# Patient Record
Sex: Female | Born: 1970 | Race: White | Hispanic: No | Marital: Married | Smoking: Never smoker
Health system: Southern US, Community
[De-identification: ages and names within clinical notes are randomized; demographics above are authoritative.]

## PROBLEM LIST (undated history)

## (undated) DIAGNOSIS — F32A Depression, unspecified: Secondary | ICD-10-CM

## (undated) DIAGNOSIS — I10 Essential (primary) hypertension: Secondary | ICD-10-CM

## (undated) DIAGNOSIS — Z9889 Other specified postprocedural states: Secondary | ICD-10-CM

## (undated) DIAGNOSIS — R51 Headache: Secondary | ICD-10-CM

## (undated) DIAGNOSIS — F329 Major depressive disorder, single episode, unspecified: Secondary | ICD-10-CM

## (undated) DIAGNOSIS — F419 Anxiety disorder, unspecified: Secondary | ICD-10-CM

## (undated) DIAGNOSIS — E119 Type 2 diabetes mellitus without complications: Secondary | ICD-10-CM

## (undated) DIAGNOSIS — G35 Multiple sclerosis: Principal | ICD-10-CM

## (undated) DIAGNOSIS — R112 Nausea with vomiting, unspecified: Secondary | ICD-10-CM

## (undated) HISTORY — PX: OTHER SURGICAL HISTORY: SHX169

## (undated) HISTORY — DX: Headache: R51

## (undated) HISTORY — DX: Other specified postprocedural states: R11.2

## (undated) HISTORY — DX: Multiple sclerosis: G35

## (undated) HISTORY — DX: Anxiety disorder, unspecified: F41.9

## (undated) HISTORY — PX: TYMPANOSTOMY TUBE PLACEMENT: SHX32

## (undated) HISTORY — DX: Major depressive disorder, single episode, unspecified: F32.9

## (undated) HISTORY — PX: BREAST BIOPSY: SHX20

## (undated) HISTORY — DX: Depression, unspecified: F32.A

---

## 1995-10-15 DIAGNOSIS — G35 Multiple sclerosis: Secondary | ICD-10-CM

## 1995-10-15 HISTORY — DX: Multiple sclerosis: G35

## 2000-07-14 ENCOUNTER — Encounter: Admission: RE | Admit: 2000-07-14 | Discharge: 2000-10-12 | Payer: Self-pay | Admitting: Anesthesiology

## 2000-08-16 HISTORY — PX: CHOLECYSTECTOMY: SHX55

## 2000-10-19 ENCOUNTER — Encounter: Admission: RE | Admit: 2000-10-19 | Discharge: 2001-01-17 | Payer: Self-pay | Admitting: Anesthesiology

## 2001-02-13 ENCOUNTER — Encounter: Admission: RE | Admit: 2001-02-13 | Discharge: 2001-04-15 | Payer: Self-pay | Admitting: Anesthesiology

## 2002-08-29 ENCOUNTER — Encounter: Payer: Self-pay | Admitting: Psychiatry

## 2002-08-29 ENCOUNTER — Ambulatory Visit (HOSPITAL_COMMUNITY): Admission: RE | Admit: 2002-08-29 | Discharge: 2002-08-29 | Payer: Self-pay | Admitting: Psychiatry

## 2010-08-12 ENCOUNTER — Ambulatory Visit: Payer: Self-pay | Admitting: Internal Medicine

## 2011-01-01 NOTE — H&P (Signed)
[facility]  Patient:    Amy Horton, Amy Horton                        MRN: [id number] Adm. Date:  [id number] Attending:  Thyra Breed CC:         Dr. Margit Banda, M.D.   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  Since her previous evaluation, she has done remarkably well on the Duragesic patch 50 mcg every three days.  She initially noted nausea, but this has waxed away after about two weeks.  She is feeling much better overall.  She continues on her Prozac, Pamelor, Betaseron, and Trileptal.  She has noted that her blood pressure seems to be staying up, and it is elevated today.  She has gone to the drug store and checked it.  She is feeling better overall and even contemplating getting back into exercise.  She continues to have numbness and pain, predominantly on the left side of her body and some ascending pain and numbness in the right lower extremity to the calf.  PHYSICAL EXAMINATION:  VITAL SIGNS:  Blood pressure 140/98, heart rate is 93, respiratory rate is 18, O2 saturation is 100%, pain level is 2 out of 10.  MUSCULOSKELETAL/NEUROLOGIC:  Her deep tendon reflexes showed some hyperreflexia on the right ankle relative to the left, otherwise symmetric. She has attenuated sensation on the left side of the body relative to the right.  IMPRESSION: 1. 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 2. Elevated blood pressure, which I have encouraged her to go ahead and see    Dr. [name]. 3. Multiple sclerosis per [name].  DISPOSITION: 1. Continue on Duragesic 50 mcg every three days, #10 with no refill. 2. Vancenase inhaler to be sprayed on the skin to try and reduce any    irritation from this prior to application of the Duragesic. 3. Follow up with me in eight weeks.  She will let us know in about  three    weeks about getting another prescription for the Duragesic sent to her. DD:  02/14/01 TD:  02/14/01 Job: [id number]

## 2011-01-01 NOTE — Op Note (Signed)
[facility]  Patient:    Amy Horton, Amy Horton                        MRN: [id number] Proc. Date: 07/25/00 Adm. Date:  [id number] Attending:  Thyra Breed CC:         Ruthell Rummage, M.D., [place], [name]  C. Lesia Sago, M.D.   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  PROCEDURE:  IV infusion of lidocaine.  INTERVAL HISTORY:  The patient has noted that her pain level has come down to about 3-4 out of 10 with regular dosing of her methadone.  She has noted it is suppressing her appetite which she feels is a very positive side effect. Nevertheless, she wishes to go ahead and try an IV infusion of lidocaine today.  PHYSICAL EXAMINATION:  VITAL SIGNS:  Blood pressure is 130/67, heart rate 74, respiratory rate is 13, O2 saturation is 94%.  GENERAL:  Pain level is 6 out of 10.  Her spirits are positive today.  NEUROLOGIC:  Grossly unchanged from her last visit.  DESCRIPTION OF PROCEDURE:  After informed consent was obtained, the patient was placed in the semi-reclining position and monitored.  An IV was established in her left upper extremity.  I personally administered 100 mg of lidocaine.  The patient noted that her pain went down to 0 out of 10.  POST-PROCEDURE CONDITION:  Stable.  I advised the patient that she may get up to 1 to 3 months worth of benefit from the injection and the concept was to try and focus down her pain so that she got more of a response to the methadone.  I advised her to continue with the methadone for the time being.  DISPOSITION: 1. Resume previous diet. 2. Limitation of activity per instruction sheet as outlined by my assistant    today. 3. Continue on current medications.  The patient is getting her methadone    through Dr. Christain Sacramento in [place]. 4. Follow up with me in two months. DD:  07/25/00 TD:  07/27/00 Job: [id number]

## 2011-01-01 NOTE — H&P (Signed)
[facility]  Patient:    Amy Horton, DIEKMAN                        MRN: [id number] Attending:  Thyra Breed, M.D. CC:         [name], M.D., [name], [name]             [name], M.D.                         History and Physical  NEW PATIENT EVALUATION  DATE OF BIRTH:  [birth year].  HISTORY:  Amy Horton is a 40 year old patient sent to [place] by Dr. [name] and Dr. [name] for an IV infusion of lidocaine for neuropathic pain of the upper extremities.  The patient was advised not to eat and the necessity of being promptly on time.  She presented 15 minutes late and had eaten her breakfast this morning, so we will not be able to do an infusion.  The patient carries the diagnosis of multiple sclerosis, which was made by Dr. [name] in 1997.  Two to two and a half years prior to her diagnosis, the patient noted the onset of left facial numbness, for which she was evaluated by Dr. [name] and advised that there was little to do at that time.  No diagnosis was rendered.  She developed a pain which radiated up from the left jaw to her left eye and into the scalp.  She had an MRI performed in [place] which was interpreted as showing "nothing wrong."  She developed diplopia in March of 1997 and was seen by an eye physician and Dr. [name] and sent back to Dr. [name] who re-read the MRI, which showed some lesions.  She underwent a spinal tap which apparently confirmed the diagnosis of MS. Following that, she developed pretty severe numbness and difficulty walking and by September of 1997, was undergoing IV infusions of Medrol; she improved. She was treated with Betaseron for a period of time, which she tapered off. She had returned for another IV infusion in the autumn of 2001 because of exacerbations of her disease.  With each infusion, she noted no improvements with her pain control.  She had been treated with p.o. Demerol by  [name], which she found useless; she has also been treated with amitriptyline and Neurontin.  She was initially treated with very low doses of Neurontin at 300 mg b.i.d. and noted minimal improvement with this; this was increased to 600 mg twice a day.  She has gone on up to a total of 1600 mg per day and not noted any improvement.  She notes no problems if she misses a dose.  She has been treated with methadone by Dr. [name], but she takes this sporadically as a p.r.n. medicine.  In addition to Demerol and methadone, she has been treated with [name] which was not helpful.  She has been switched to nortriptyline from amitriptyline and seems to be doing better with this.  The patient is quite emotional and teary-eyed at times in describing her pain syndrome.  She describes her pain as a throbbing, aching-type discomfort predominantly in the left upper extremity which comes and goes.  She does not feel emotional support from her family and at times, feels as though she is not listened to.  CURRENT MEDICATIONS 1. Methadone; she has taken 60 tablets since October. 2. Neurontin  1600 mg per day. 3. Nortriptyline. 4. [name], which she takes for headaches, which are very helpful. 5. [name].  ALLERGIES:  CODEINE causes nausea and vomiting.  FAMILY HISTORY:  Positive for lung cancer.  PAST SURGICAL HISTORY:  Significant for myringotomy and tubes, cholecystectomy, ERCP and breast biopsies x 3.  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 She does not smoke nor drink alcohol.  ACTIVE MEDICAL PROBLEMS:  Depression and MS.  REVIEW OF SYSTEMS:  GENERAL:  Significant for frequent sweating.  HEAD: Significant for headaches which she says may be migraines but are helped by [name] very significantly.  EYES:  Significant for corrective lenses and history of diplopia.  NOSE, MOUTH AND THROAT:  Negative.  EARS:  Negative. PULMONARY:  Negative.   CARDIOVASCULAR:  Negative.  GI:  Negative.  GU: Negative.  MUSCULOSKELETAL:  Negative.  NEUROLOGIC:  See HPI.  HEMATOLOGIC: Negative.  ENDOCRINE:  Negative.  CUTANEOUS:  Negative.  PSYCHIATRIC: Significant for depression.  ALLERGY/IMMUNOLOGIC:  Negative.  EXAMINATION  VITAL SIGNS:  Blood pressure 131/71, heart rate is ____, respiratory rate is 18, O2 saturation is 96% and pain level is described as 8/10.  GENERAL:  This is an obese pleasant female in no acute distress.  HEENT:  Head was normocephalic, atraumatic.  Eyes:  Extraocular movements intact with conjunctivae and sclerae significant for pterygiums, left greater than right; otherwise, clear.  Nose:  Patent nares.  Oropharynx is free of lesions.  NECK:  Supple without lymphadenopathy.  Carotids are 2+ and symmetric without bruits.  LUNGS:  Clear.  HEART:  Regular rate and rhythm.  BREASTS:  Not performed.  GENITALIA:  Not performed.  RECTAL:  Not performed.  ABDOMEN:  Exam revealed well-healed surgical scars with absence of superficial abdominal reflexes.  BACK:  Exam revealed intact gait with no tenderness to percussion over the vertebrae.  EXTREMITIES:  No cyanosis, clubbing nor edema, with radial pulses and dorsalis pedis pulses 2+ and symmetric.  NEUROLOGIC:  The patient was oriented x 4.  Cranial nerves II-XII were grossly intact.  Deep tendon reflexes were symmetric in the upper and lower extremities with no clonus.  Plantar reflexes were downgoing.  Motor was 5/5 with symmetric bulk and tone.  Sensory was intact to pinprick and scratch sense as well as vibratory sense.  Coordination was intact to finger-to-nose.  IMPRESSION 1. Multiple sclerosis with vague throbbing pains on the left side of the body    which probably represent a central pain syndrome related to the multiple    sclerosis. 2. Depression. 3. History of headaches. 4. History of endoscopic retrograde  cholangiopancreatogram.  DISPOSITION  1. I advised the patient that she was sent to [place] for evaluation and for an IV    infusion of lidocaine.  She knew that she was coming for this today, yet    she failed to remain n.p.o., she failed to arrive on time and she failed to    bring a driver.  All of these issues were addressed with her prior to her    arriving and I am concerned about her motivation to follow the    instructions. 2. I advised her that methadone is a good drug but you have to take it on a    regular basis rather than p.r.n. and if she has only taken 60 tablets in    the past six weeks, she obviously is not taking it in a way that would be  of any benefit to her.  I think with regard to the methadone, that it    probably is reflective of her previous decision to wean herself off the    Betaseron.  She seems to either not understand the need to take the    medication or seems to take it on herself how she is going to take the    medication. 3. I have encouraged her to continue on the Neurontin, although I feel that    she probably is not getting much of a benefit and she might benefit from    Trileptal.  I advised her that she really needed to follow up with    [name] with regard to initiating Trileptal in her case, as well as    followup of her MS. 4. I advised her that I would be happy to give her IV infusions of lidocaine    as a trial to see whether it would be of any benefit, but I would encourage    her to follow up with [name] for the MS and she has troubles arriving    here on time so it probably would be best if she continues on opiates to    receive these through Dr. [name] office, especially since it seems that    the distance creates a hardship.  If she takes methadone, she probably    ought to take at least 5 mg three times a day on a regular basis and if    this is not helpful, go up to four times a day but not go above this dose.    I advised her  that it would only partially help her pain. 5. I will plan to see her back in one to two weeks to perform an IV infusion    if she can arrive on time and present with a driver, n.p.o.  I advised her    that I would go ahead and forward my recommendations with regard to the    Trileptal to [name] and the methadone to Dr. [name]. DD:  07/15/00 TD:  07/15/00 Job: [id number]

## 2011-01-01 NOTE — Procedures (Signed)
[facility]  Patient:    ILSA, BONELLO                        MRN: [id number] Proc. Date: 11/21/00 Adm. Date:  [id number] Attending:  Thyra Breed CC:         Ruthell Rummage, M.D.  [name], M.D.   Procedure Report  PROCEDURE:  IV infusion of lidocaine.  DIAGNOSIS:  Central pain syndrome with underlying MS.  INTERVAL HISTORY:  The patients been to see [name] PA and they discussed the possibility of psychologic counseling as well as starting her on trileptal. The patient is very amendable to both. She noted that since her last visit four weeks ago, she has been under fairly good control with regard to her pain but continues to have left sided decreased sensation and pains in her right thigh at times. Her fingers are numb and tingling in the right hand today.  PHYSICAL EXAMINATION:  Blood pressure 118/52, heart rates 84, respiratory rate 18, O2 saturations 100%, temperature is 97.9, and pain level is 8/10. The patient describes good spirits today. She moves all fours and was able to ambulate in.  DESCRIPTION OF PROCEDURE:  After informed consent was obtained, an IV was established in her left upper extremity. Monitors were placed. I personally administered 300 mg of lidocaine. Post procedure, the patient noted that her pain was considerably down.  DISPOSITION: 1. Resume previous diet. 2. Limitations in activities per instruction sheet as outlined by my assistant    today. 3. Continue on current medications and encouraged to go ahead and start the    trileptal as prescribed by [name]. 4. Followup with me in four weeks for repeat infusion. If she is doing this    well at her next visit, we will consider spreading out her infusions. DD: 11/21/00 TD:  11/21/00 Job: [id number]

## 2011-01-01 NOTE — Procedures (Signed)
[facility]  Patient:    Amy Horton, Amy Horton                        MRN: [id number] Proc. Date: 09/26/00 Adm. Date:  [id number] Attending:  Thyra Breed CC:         Dr. 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  PROCEDURE:  IV infusion of lidocaine.  SURGEON:  Thyra Breed, M.D.  INTERVAL HISTORY:  The patient received about a weeks worth of benefit from her last injection two months ago.  She has maintained herself on the methadone on a regular basis, and does note that there is some improvement with the regard to the pain with regard to this.  She is scheduled to go over to [facility] to be seen by Dr. Elza Rafter in the Department of Neurology in the ensuing months.  PHYSICAL EXAMINATION:  Blood pressure 119/81, heart rate 81, respiratory rate is 15, and O2 saturations on 97%.  Pain level is 8 out of 10.  Her spirits are good today and she is willing to try another IV infusion today, although she did not describe a very significant improvement with the last infusion.  DESCRIPTION OF PROCEDURE:  After informed consent was obtained, the patient was placed in a semirecumbent position and monitored.  An IV was established in the left upper extremity.  I personally administered 250 mg of lidocaine intravenously.  The patient noted that her pain level went down to 0 out of 10.  POSTPROCEDURE CONDITION:  Stable.  DISCHARGE INSTRUCTIONS: 1. Resume previous diet. 2. Limitation and activities per instruction sheet. 3. Continue on current medications. 4. I will see the patient back in two months to consider repeat infusion if    she gets more of a sustained response to this infusion.  Otherwise, I am    not optimistic she will start to respond in the future. DD:  09/26/00 TD:   09/26/00 Job: [id number]

## 2011-01-01 NOTE — Procedures (Signed)
[facility]  Patient:    Amy Horton, Amy Horton                        MRN: [id number] Proc. Date: 10/24/00 Adm. Date:  [id number] Attending:  Thyra Breed CC:         Ruthell Rummage, M.D., [facility]   Procedure Report  PROCEDURE:  Intravenous infusion of lidocaine.  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  ANESTHESIOLOGIST:  Thyra Breed, M.D.  INTERVAL HISTORY:  The patient was doing well up until about three or four days ago when she noted that her pain was starting to reoccur.  Overall, she felt much improved.  She still is scheduled to see Dr. Elza Rafter over at [facility].  She continues on the methadone.  Her other medications are unchanged.  She complains predominantly of left lateral thigh and left shoulder discomfort to date.  She rates her pain at about 7 to 8/10.  PHYSICAL EXAMINATION:  VITAL SIGNS:  Blood pressure 124/80, heart rate 97, respiratory rate 20, O2 saturation 97%, temperature 97.7.  NEUROLOGIC:  Grossly unchanged.  DESCRIPTION OF PROCEDURE:  After informed consent was obtained, the patient was placed in the semirecumbent position and monitored.  An IV was established in the left hand.  I personally administered 250 mg of lidocaine intravenously.  Her pain went down to a level of 0.5/10.  POSTPROCEDURE CONDITION:  Stable.  DISCHARGE INSTRUCTIONS: 1. Resume previous diet. 2. Limitation of activities per instruction sheet. 3. Continue on current medications. 4. Follow up with me in four weeks for repeat infusion. DD:  10/24/00 TD:  10/25/00 Job: [id number]

## 2011-01-01 NOTE — H&P (Signed)
[facility]  Patient:    Amy Horton, Amy Horton                        MRN: [id number] Adm. Date:  [id number] Attending:  Thyra Breed CC:         [name], M.D.  [name], M.D.   History and Physical  FOLLOWUP EVALUATION:  The patient was originally scheduled to come in for an IV infusion but she got no benefits from her last infusion.  Dr. [name] has told her he will no longer write for her opiates and she ran out of her methadone over the weekend.  She was scheduled for an MRI yesterday but apparently the MRI machine broke down.  She saw Dr. Luana Shu P.A., [facility], two weeks ago and apparently there was some miscommunication because the patient has not increased her Trileptal, but she does not recall being told to; apparently, Victorino Dike wanted her to go up by 50 mg increments every week.  I encouraged the patient to contact their office, as she has 150 mg tablets which are scored and she would have to go up by 75 mg every week.  She apparently has had a normal EEG.  She continues to have pain in her left thigh and has had a severe headache on the right side of her head for which she went to the emergency room last night and was given Demerol.  CURRENT MEDICATIONS: 1. Methadone, which she is taking five tablets per day. 2. Prozac 20 mg per day. 3. Pamelor 50 mg two at night. 4. Betaseron. 5. Trileptal 150 mg twice a day.  The patient is obviously frustrated, upset and teary-eyed at times.  PHYSICAL EXAMINATION:  VITAL SIGNS:  Blood pressure 127/90.  Heart rate is 92.  Respiratory rate is 19.  O2 saturation is 100%.  NEUROLOGIC:  Cranial nerves II-XII are grossly intact.  Deep tendon reflexes are symmetric in the upper and lower extremities.  Sensory exam reveals attenuated pinprick on the left side, especially over the lateral aspect of the thigh.  IMPRESSION: 1. Central pain syndrome with underlying multiple  sclerosis. 2. Headaches per [name].  DISPOSITION: 1. I advised the patient that it was nobodys intention that she should go    through withdrawal from opiates and that she should have been tapered off    if the intent was to stop writing for these and I went ahead and placed her    on Duragesic 50 mcg every 3 days, #10.  My associate, Johnney Ou, R.N.,    reviewed how to use the medication. 2. She was advised to contact Dr. Wyvonnia Lora office with regard to how to adjust    upward her Trileptal. 3. I advised her that I would like to see her back in four weeks. 4. I advised her I would hold off on any infusions for the time-being until    she gets to the target amount of Trileptal as outlined by Dr. Wyvonnia Lora P.A. DD:  01/17/01 TD:  01/17/01 Job: [id number]

## 2011-01-01 NOTE — Procedures (Signed)
[facility]  Patient:    Amy Horton, Amy Horton                        MRN: [id number] Proc. Date: 12/19/00 Adm. Date:  [id number] Attending:  Thyra Breed CC:         [name], M.D.  Ruthell Rummage, M.D.   Procedure Report  PROCEDURE:  IV infusion of lidocaine.  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  INTERVAL HISTORY:  The patients not noted very lasting benefits from the IV infusions and we discussed the fact that if she does not get more of a longer lasting infusion today that I would not recommend continuing with the infusions. She is worried about the fact that she is not having a very positive response to the combination of nortriptyline and methadone with the trileptal that she is taking. She has developed increased problems with left sided weakness and pain and was started on the prednisone dosepak by [name] recently and is concerned that her pain may not come under any better control. She does describe shooting pains in the left upper extremity with a deep dull ache in her left lower extremity in the thigh area as well as in the left upper arm.  PHYSICAL EXAMINATION: Blood pressure 133/70, heart rate 78, respiratory rate 16, O2 saturations 99%, pain level is 8/10. She exhibits no gross neurologic changes from her last visit.  DESCRIPTION OF PROCEDURE:  After informed consent was obtained, the patient was placed in a semirecumbent position and monitored. An IV was established in the left upper extremity. I personally administered 300 mg of lidocaine intravenously. She noted that her pain markedly improved to a level of about of 1/10.  CONDITION POST PROCEDURE:  Stable.  DISCHARGE INSTRUCTIONS: 1. Presume previous diet. 2. Limitations in activities per instruction sheet as outlined by my assistant    today. 3. Continue on current medications. 4. Followup with me in four weeks at which time we may or  may not proceed    with IV infusion. I advised her with central neuropathic pain, treatment    is very difficult and opiates may or may not be very helpful. I agree    with Dr. Christain Sacramento in limiting how high up we can go with the methadone. The    other option would be for her to consider a different opiate such as    Duragesic and I will discuss this with her when I see her in follow-up in    four weeks. I advised her the other options were to try other    anticonvulsants if the trileptal at optimal doses is not helpful. She is    only on 150 mg twice a day at present. DD:  12/19/00 TD:  12/20/00 Job: [id number]

## 2012-11-21 ENCOUNTER — Ambulatory Visit (INDEPENDENT_AMBULATORY_CARE_PROVIDER_SITE_OTHER): Payer: Medicare Other | Admitting: Diagnostic Neuroimaging

## 2012-11-21 ENCOUNTER — Encounter: Payer: Self-pay | Admitting: Diagnostic Neuroimaging

## 2012-11-21 VITALS — BP 167/112 | HR 93 | Temp 97.1°F | Ht 65.5 in | Wt 239.5 lb

## 2012-11-21 DIAGNOSIS — G35 Multiple sclerosis: Secondary | ICD-10-CM

## 2012-11-21 NOTE — Patient Instructions (Addendum)
I will order MRI scans and plan to start gilenya.

## 2012-11-21 NOTE — Progress Notes (Signed)
[facility]  PATIENT: Amy Horton DOB: 12-15-1970  REFERRING CLINICIAN: Dr. Reuel Boom HISTORY FROM: patient  REASON FOR VISIT: new consult   HISTORICAL  CHIEF COMPLAINT:  Chief Complaint  Patient presents with  . Multiple Sclerosis    HISTORY OF PRESENT ILLNESS:   42 year old right-handed female here for evaluation of multiple sclerosis.  Patient developed first symptoms in 1997 with double vision and left facial numbness. Patient had MRI lumbar puncture was diagnosed with multiple sclerosis. She started on Betaseron in 1999. She was on and off of Betaseron until 2007, related to pregnancies.in 2007 she was switched to Copaxone. She was on this until 2009. At this point patient stopped taking medication. She was seeing another neurologist for last few years, but not on any multiple scars medications. She has not seen a neurologist last 2 years.  Ongoing issues now include memory and concentration problems, left-sided numbness and weakness.  REVIEW OF SYSTEMS: Full 14 system review of systems performed and notable only for fatigue blurred vision double vision left arm and left leg pain memory loss headache numbness weakness difficulty swallowing tremor snoring depression anxiety distant his activities and racing thoughts.  ALLERGIES: Allergies  Allergen Reactions  . Codeine     HOME MEDICATIONS: No outpatient prescriptions prior to visit.   No facility-administered medications prior to visit.    PAST MEDICAL HISTORY: Past Medical History  Diagnosis Date  . Multiple sclerosis March 1997  . Headache   . Anxiety and depression     PAST SURGICAL HISTORY: Past Surgical History  Procedure Laterality Date  . Cholecystectomy  2002  . Cesarean section  [id number]    x2  . Breast biopsy      teenager  . Tympanostomy tube placement      x2 as a child    FAMILY HISTORY: History reviewed. No pertinent family history.  SOCIAL HISTORY:  History     Social History  . Marital Status: Married    Spouse Name: N/A    Number of Children: 3  . Years of Education: N/A   Occupational History  . Not on file.   Social History Main Topics  . Smoking status: Never Smoker   . Smokeless tobacco: Not on file  . Alcohol Use: No  . Drug Use: No  . Sexually Active: Not on file   Other Topics Concern  . Not on file   Social History Narrative   Pt lives at home with her spouse and her children.    She does consume caffeine.     PHYSICAL EXAM  Filed Vitals:   11/21/12 1105  BP: 167/112  Pulse: 93  Temp: 97.1 F (36.2 C)  TempSrc: Oral  Height: 5' 5.5" (1.664 m)  Weight: 239 lb 8 oz (108.636 kg)   Body mass index is 39.23 kg/(m^2).  GENERAL EXAM: Patient is in no distress  CARDIOVASCULAR: Regular rate and rhythm, no murmurs, no carotid bruits  NEUROLOGIC: MENTAL STATUS: awake, alert, language fluent, comprehension intact, naming intact CRANIAL NERVE: no papilledema on fundoscopic exam, pupils equal and reactive to light, visual fields full to confrontation, extraocular muscles TESTING SHOWS DECREASED ADDUCTION ON LATERAL GAZE (RIGHT AND LEFT) OF ADDUCTING EYES WITH SUBJECTIVE DIPLOPIA. NO NYSTAGMUS. Facial sensation and strength symmetric, uvula midline, shoulder shrug symmetric, tongue midline. MOTOR: normal bulk and tone, DECR LUE AND LLE STRENGTH (4/5).  SENSORY: DECR IN LUE AND LLE. COORDINATION: finger-nose-finger, fine finger movements SLOW REFLEXES: deep tendon reflexes BRISK and symmetric WITH  SPREAD GAIT/STATION: SLOW CAUTIOUS GAIT. ROMBERG NEG. SLOW CAREFUL TANDEM IS POSSIBLE.   DIAGNOSTIC DATA (LABS, IMAGING, TESTING) - I reviewed patient records, labs, notes, testing and imaging myself where available.  No results found for this basename: WBC, HGB, HCT, MCV, PLT   No results found for this basename: na, k, cl, co2, glucose, bun, creatinine, calcium, prot, albumin, ast, alt, alkphos, bilitot, gfrnonaa, gfraa    No results found for this basename: CHOL, HDL, LDLCALC, LDLDIRECT, TRIG, CHOLHDL   No results found for this basename: HGBA1C   No results found for this basename: VITAMINB12   No results found for this basename: TSH     ASSESSMENT AND PLAN  42 y.o. year old female  has a past medical history of Multiple sclerosis (March 1997); Headache; and Anxiety and depression. here with multiple sclerosis diagnosed in 1997, previously on Betaseron and Copaxone. No medication since 2009. I will check MRI of the brain and cervical spine to establish new baseline. I will plan to start her on Gilenya soon.  Orders Placed This Encounter  Procedures  . MR Brain W Wo Contrast  . MR Cervical Spine W Wo Contrast    Suanne Marker, MD 11/21/2012, 11:47 AM Certified in Neurology, Neurophysiology and Neuroimaging  [facility] [address] [place], [name] [address] [phone]

## 2012-11-22 LAB — COMPREHENSIVE METABOLIC PANEL
ALT: 16 IU/L (ref 0–32)
AST: 15 IU/L (ref 0–40)
Albumin/Globulin Ratio: 1.4 (ref 1.1–2.5)
Albumin: 4.2 g/dL (ref 3.5–5.5)
Alkaline Phosphatase: 115 IU/L (ref 39–117)
BUN/Creatinine Ratio: 6 — ABNORMAL LOW (ref 9–23)
BUN: 6 mg/dL (ref 6–24)
CO2: 25 mmol/L (ref 19–28)
Calcium: 9.2 mg/dL (ref 8.7–10.2)
Chloride: 99 mmol/L (ref 97–108)
Creatinine, Ser: 1.02 mg/dL — ABNORMAL HIGH (ref 0.57–1.00)
GFR calc Af Amer: 79 mL/min/{1.73_m2} (ref >59)
GFR calc non Af Amer: 68 mL/min/{1.73_m2} (ref >59)
Globulin, Total: 3.1 g/dL (ref 1.5–4.5)
Glucose: 111 mg/dL — ABNORMAL HIGH (ref 65–99)
Potassium: 3.6 mmol/L (ref 3.5–5.2)
Sodium: 138 mmol/L (ref 134–144)
Total Bilirubin: 0.4 mg/dL (ref 0.0–1.2)
Total Protein: 7.3 g/dL (ref 6.0–8.5)

## 2012-11-22 LAB — CBC WITH DIFFERENTIAL/PLATELET
Basophils Absolute: 0.1 10*3/uL (ref 0.0–0.2)
Basos: 1 % (ref 0–3)
Eos: 2 % (ref 0–5)
Eosinophils Absolute: 0.1 10*3/uL (ref 0.0–0.4)
HCT: 38.5 % (ref 34.0–46.6)
Hemoglobin: 13 g/dL (ref 11.1–15.9)
Immature Grans (Abs): 0 10*3/uL (ref 0.0–0.1)
Immature Granulocytes: 0 % (ref 0–2)
Lymphocytes Absolute: 1.6 10*3/uL (ref 0.7–3.1)
Lymphs: 20 % (ref 14–46)
MCH: 29.7 pg (ref 26.6–33.0)
MCHC: 33.8 g/dL (ref 31.5–35.7)
MCV: 88 fL (ref 79–97)
Monocytes Absolute: 0.4 10*3/uL (ref 0.1–0.9)
Monocytes: 5 % (ref 4–12)
Neutrophils Absolute: 5.9 10*3/uL (ref 1.4–7.0)
Neutrophils Relative %: 72 % (ref 40–74)
RBC: 4.38 x10E6/uL (ref 3.77–5.28)
RDW: 14.1 % (ref 12.3–15.4)
WBC: 8.1 10*3/uL (ref 3.4–10.8)

## 2012-11-22 LAB — VARICELLA ZOSTER ANTIBODY, IGG: Varicella zoster IgG: 621 index — ABNORMAL HIGH (ref 0–134)

## 2012-12-12 ENCOUNTER — Other Ambulatory Visit: Payer: Self-pay

## 2012-12-24 ENCOUNTER — Ambulatory Visit
Admission: RE | Admit: 2012-12-24 | Discharge: 2012-12-24 | Disposition: A | Discharge status: Home / Self Care | Payer: Medicare Other | Source: Ambulatory Visit | Attending: Diagnostic Neuroimaging | Admitting: Diagnostic Neuroimaging

## 2012-12-24 DIAGNOSIS — G35 Multiple sclerosis: Secondary | ICD-10-CM

## 2012-12-24 MED ORDER — GADOBENATE DIMEGLUMINE 529 MG/ML IV SOLN
20.0000 mL | Freq: Once | INTRAVENOUS | Status: AC | PRN
  Administered 2012-12-24: 20 mL via INTRAVENOUS

## 2013-01-04 ENCOUNTER — Telehealth: Payer: Self-pay | Admitting: Diagnostic Neuroimaging

## 2013-01-04 NOTE — Telephone Encounter (Signed)
I called pt with MRI results.   (no chnage form 09-11-09) .  She also relayed that faxed to [place] the [employer] form to be signed and faxed back.   I did received this and is in Dr. Richrd Humbles office to sign.

## 2013-01-12 ENCOUNTER — Encounter: Payer: Self-pay | Admitting: *Deleted

## 2013-01-17 ENCOUNTER — Telehealth: Payer: Self-pay | Admitting: Diagnostic Neuroimaging

## 2013-01-17 NOTE — Telephone Encounter (Signed)
Patient states you told her about a cream to help with pain and would you for you to call it in.

## 2013-01-19 ENCOUNTER — Telehealth: Payer: Self-pay | Admitting: Diagnostic Neuroimaging

## 2013-01-19 NOTE — Telephone Encounter (Signed)
I called and spoke with the patient who stated she has had this pain for seven days which starts from her shoulder to her finger on the left side.

## 2013-02-02 ENCOUNTER — Telehealth: Payer: Self-pay | Admitting: Nurse Practitioner

## 2013-02-02 NOTE — Telephone Encounter (Signed)
Please call patient and let her know that we are sending order for pain cream to [facility], they will call her directly to get her information.  [phone] on her home phone or an [phone] number on her cell phone.

## 2013-02-02 NOTE — Telephone Encounter (Signed)
Order for neuropathic pain cream given to Amy Horton, CRPT for fax to [facility].

## 2013-02-02 NOTE — Telephone Encounter (Signed)
Pt called and I relayed the information to her below.  She had spoken to them, cost was too much.  I told her to call them back about financial assistance.  (paying cash $1/ gram).  She said that she is better at this time but will try and call them back and ask.

## 2013-02-20 ENCOUNTER — Ambulatory Visit: Payer: Medicare Other | Admitting: Diagnostic Neuroimaging

## 2013-03-06 ENCOUNTER — Encounter: Payer: Self-pay | Admitting: Diagnostic Neuroimaging

## 2013-03-23 ENCOUNTER — Ambulatory Visit: Payer: Medicare Other | Admitting: Diagnostic Neuroimaging

## 2013-04-11 ENCOUNTER — Ambulatory Visit: Payer: Medicare Other | Admitting: Diagnostic Neuroimaging

## 2013-04-26 ENCOUNTER — Telehealth: Payer: Self-pay | Admitting: Diagnostic Neuroimaging

## 2013-04-27 ENCOUNTER — Other Ambulatory Visit: Payer: Self-pay | Admitting: Nurse Practitioner

## 2013-04-27 NOTE — Telephone Encounter (Signed)
Called and talked to the patient moved her to lynn schedule  For first thing Monday morning.

## 2013-04-30 ENCOUNTER — Ambulatory Visit: Payer: Self-pay | Admitting: Nurse Practitioner

## 2013-06-29 ENCOUNTER — Telehealth: Payer: Self-pay | Admitting: Diagnostic Neuroimaging

## 2013-06-29 NOTE — Telephone Encounter (Signed)
Spoke with patient and she said that she has noticed leg dragging started the middle of Oct.  She was doing water aerobics, had a problem moving leg, stopped the class and it got better for a week.  She felt when walking track, going down steps,felt as though her  toe was dragging, almost fell, feels it more in afternoon or when fatigue.

## 2013-07-02 ENCOUNTER — Ambulatory Visit (INDEPENDENT_AMBULATORY_CARE_PROVIDER_SITE_OTHER): Payer: Medicare Other | Admitting: Diagnostic Neuroimaging

## 2013-07-02 ENCOUNTER — Encounter: Payer: Self-pay | Admitting: Diagnostic Neuroimaging

## 2013-07-02 VITALS — BP 176/115 | HR 86 | Ht 65.5 in | Wt 236.0 lb

## 2013-07-02 DIAGNOSIS — G35 Multiple sclerosis: Secondary | ICD-10-CM

## 2013-07-02 MED ORDER — PREDNISONE 10 MG PO TABS: ORAL_TABLET | ORAL | Status: DC

## 2013-07-02 MED ORDER — BACLOFEN 10 MG PO TABS: 10.0000 mg | ORAL_TABLET | Freq: Three times a day (TID) | ORAL | Status: DC

## 2013-07-02 NOTE — Progress Notes (Signed)
[facility]  PATIENT: Amy Horton DOB: 1971/03/25  REFERRING CLINICIAN: Dr. Reuel Boom HISTORY FROM: patient  REASON FOR VISIT: new consult   HISTORICAL  CHIEF COMPLAINT:  Chief Complaint  Patient presents with  . Follow-up    MS    HISTORY OF PRESENT ILLNESS:   UPDATE 07/02/13: Since last visit patient was started on Gilenya in May 2014. She did well with starting the medication. No ongoing side effects. She has not seen by ophthalmology for followup exam but is planning to by the end of the month.  Over past few months patient has noted some increasing "tremor" which she describes as a shaking sensation on the inside and outside, especially when she leaves her home. She had something similar in the past which was more associated with anxiety. However currently she does not feel that much nervousness or anxiety she does feel the shaking sensation.  Over past one to 2 months patient is also noted some increasing problems with cognitive decline especially remembering driving directions, recent conversations and recent events.  Over past 5 weeks patient has noted some increasing heaviness and weakness in her left foot and left leg. She has noted a "high-stepping" gait especially with her left leg. No numbness in the left leg.   Over this past weekend patient noticed spasm in her left side of her face. She feels like her left cheek is drawing upward. She remembers a similar symptom back in 1997.   PRIOR HPI (11/21/12): 42 year old right-handed female here for evaluation of multiple sclerosis.  Patient developed first symptoms in 1997 with double vision and left facial numbness. Patient had MRI lumbar puncture was diagnosed with multiple sclerosis. She started on Betaseron in 1999. She was on and off of Betaseron until 2007, related to pregnancies.in 2007 she was switched to Copaxone. She was on this until 2009. At this point patient stopped taking medication. She  was seeing another neurologist for last few years, but not on any multiple sclerosis medications. She has not seen a neurologist last 2 years.  Ongoing issues now include memory and concentration problems, left-sided numbness and weakness.  REVIEW OF SYSTEMS: Full 14 system review of systems performed and notable only for left leg weakness, memory loss, tremor, difficulty swallowing, fatigue.   ALLERGIES: Allergies  Allergen Reactions  . Codeine     HOME MEDICATIONS: Outpatient Prescriptions Prior to Visit  Medication Sig Dispense Refill  . acyclovir (ZOVIRAX) 400 MG tablet Take 1 tablet by mouth as needed.      . DULoxetine (CYMBALTA) 30 MG capsule Take 1 capsule by mouth 2 (two) times daily.       No facility-administered medications prior to visit.    PAST MEDICAL HISTORY: Past Medical History  Diagnosis Date  . Multiple sclerosis March 1997  . Headache(784.0)   . Anxiety and depression     PAST SURGICAL HISTORY: Past Surgical History  Procedure Laterality Date  . Cholecystectomy  2002  . Cesarean section  [id number]    x2  . Breast biopsy      teenager  . Tympanostomy tube placement      x2 as a child    FAMILY HISTORY: History reviewed. No pertinent family history.  SOCIAL HISTORY:  History   Social History  . Marital Status: Married    Spouse Name: Rocky Link    Number of Children: 3  . Years of Education: College   Occupational History  .      n/a  Social History Main Topics  . Smoking status: Never Smoker   . Smokeless tobacco: Never Used  . Alcohol Use: No  . Drug Use: No  . Sexual Activity: Not on file   Other Topics Concern  . Not on file   Social History Narrative   Pt lives at home with her spouse and her children.    She does consume caffeine.     PHYSICAL EXAM  Filed Vitals:   07/02/13 1122  BP: 176/115  Pulse: 86  Height: 5' 5.5" (1.664 m)  Weight: 236 lb (107.049 kg)   Body mass index is 38.66 kg/(m^2).  GENERAL  EXAM: Patient is in no distress  CARDIOVASCULAR: Regular rate and rhythm, no murmurs, no carotid bruits  NEUROLOGIC: MENTAL STATUS: awake, alert, language fluent, comprehension intact, naming intact; SLIGHTLY NERVOUS APPEARING CRANIAL NERVE: no papilledema on fundoscopic exam, pupils equal and reactive to light, visual fields full to confrontation, extraocular muscles intact, no nystagmus, facial sensation symmetric, LEFT NASALIS AND PARTIAL HEMIFACIAL TONIC SPASM. Uvula midline, shoulder shrug symmetric, tongue midline. MOTOR: POSTURAL TREMOR IN BUE. LUE 4+, LLE 4.  SENSORY: normal and symmetric to light touch, temperature, vibration COORDINATION: finger-nose-finger, fine finger movements normal REFLEXES: deep tendon reflexes present and symmetric GAIT/STATION: DIFF WITH HEEL/TOE GAIT ON LEFT LEG. CANNOT TANDEM. UNSTEADY.   DIAGNOSTIC DATA (LABS, IMAGING, TESTING) - I reviewed patient records, labs, notes, testing and imaging myself where available.  Lab Results  Component Value Date   WBC 8.1 11/21/2012      Component Value Date/Time   NA 138 11/21/2012 1311   No results found for this basename: CHOL,  HDL,  LDLCALC,  LDLDIRECT,  TRIG,  CHOLHDL   No results found for this basename: HGBA1C   No results found for this basename: VITAMINB12   No results found for this basename: TSH   MRI brain (with and without) demonstrating: 1. Multiple periventricular and subcortical chronic demyelinating plaques.  2. No acute plaques are seen.  3. No change from MRI on 09/11/09.  MRI cervical spine (with and without) demonstrating: 1. Subtle T2 hyperintensities within the spinal cord at C2-3, C3-4, C6-7 levels but not clearly seen on axial views, may represent artifact.  2. Disc bulging at C5-6, C6-7 and T2-3. No spinal stenosis or foraminal narrowing. 3. No change from MRI on 09/11/09.    ASSESSMENT AND PLAN  42 y.o. year old female  has a past medical history of Multiple sclerosis  (March 1997); Headache(784.0); and Anxiety and depression. here with multiple sclerosis diagnosed in 1997, previously on Betaseron and Copaxone. Now on gilenya since May 2014. Was doing well until last 1-2 months, now with possible exacerbation. I will check MRI brain to evaluate for possible MS progression. We'll check CBC and JC virus antibody test, in preparation for possible transition to Tysabri. Will treat with IV steroids and prednisone taper. I will give patient some baclofen for muscle spasms.  PLAN: Orders Placed This Encounter  Procedures  . MR Brain W Wo Contrast  . CBC With differential/Platelet  . Stratify JCV Antibody Test ([facility])  . Home Health  . Face-to-face encounter (required for Medicare/Medicaid patients)   Meds ordered this encounter  Medications  . predniSONE (DELTASONE) 10 MG tablet    Sig: Take 60mg  on day 1. Reduce by 10mg  each subsequent day. (60, 50, 40, 30, 20, 10, stop)    Dispense:  21 tablet    Refill:  0  . baclofen (LIORESAL) 10 MG tablet  Sig: Take 1 tablet (10 mg total) by mouth 3 (three) times daily.    Dispense:  90 each    Refill:  6   Return in about 2 months (around 09/01/2013).    Suanne Marker, MD 07/02/2013, 12:50 PM Certified in Neurology, Neurophysiology and Neuroimaging  [facility] [address] [place], [name] [address] [phone]

## 2013-07-02 NOTE — Patient Instructions (Signed)
I will check MRI brain, lab testing for evaluation.  I will set up home IV steroids for treatment of possible exacerbation.  After IV steroids x3 days, start prednisone Dosepak to be tapered over 6 days (starting at 60 mg daily and reducing by 10 mg per day).  Try baclofen half tablet daily for spasms. Increased gradually up to one full tablet 3 times a day.

## 2013-07-03 LAB — CBC WITH DIFFERENTIAL
Basos: 0 %
HCT: 35.6 % (ref 34.0–46.6)
Hemoglobin: 12.3 g/dL (ref 11.1–15.9)
Lymphs: 3 %
Monocytes: 7 %
Neutrophils Absolute: 6.5 10*3/uL (ref 1.4–7.0)
WBC: 7.4 10*3/uL (ref 3.4–10.8)

## 2013-07-18 ENCOUNTER — Ambulatory Visit
Admission: RE | Admit: 2013-07-18 | Discharge: 2013-07-18 | Disposition: A | Discharge status: Home / Self Care | Payer: Medicare Other | Source: Ambulatory Visit | Attending: Diagnostic Neuroimaging | Admitting: Diagnostic Neuroimaging

## 2013-07-18 DIAGNOSIS — G35 Multiple sclerosis: Secondary | ICD-10-CM

## 2013-07-18 MED ORDER — GADOBENATE DIMEGLUMINE 529 MG/ML IV SOLN
20.0000 mL | Freq: Once | INTRAVENOUS | Status: AC | PRN
  Administered 2013-07-18: 20 mL via INTRAVENOUS

## 2013-07-26 ENCOUNTER — Ambulatory Visit: Payer: Medicare Other | Admitting: Diagnostic Neuroimaging

## 2013-09-03 ENCOUNTER — Ambulatory Visit: Payer: Medicare Other | Admitting: Diagnostic Neuroimaging

## 2014-05-31 ENCOUNTER — Other Ambulatory Visit: Payer: Self-pay

## 2014-10-29 DIAGNOSIS — I10 Essential (primary) hypertension: Secondary | ICD-10-CM | POA: Diagnosis not present

## 2014-10-29 DIAGNOSIS — E1165 Type 2 diabetes mellitus with hyperglycemia: Secondary | ICD-10-CM | POA: Diagnosis not present

## 2014-10-29 DIAGNOSIS — F411 Generalized anxiety disorder: Secondary | ICD-10-CM | POA: Diagnosis not present

## 2014-10-29 DIAGNOSIS — E782 Mixed hyperlipidemia: Secondary | ICD-10-CM | POA: Diagnosis not present

## 2014-10-29 DIAGNOSIS — F329 Major depressive disorder, single episode, unspecified: Secondary | ICD-10-CM | POA: Diagnosis not present

## 2014-11-05 DIAGNOSIS — I10 Essential (primary) hypertension: Secondary | ICD-10-CM | POA: Diagnosis not present

## 2014-11-05 DIAGNOSIS — J309 Allergic rhinitis, unspecified: Secondary | ICD-10-CM | POA: Diagnosis not present

## 2014-11-05 DIAGNOSIS — G35 Multiple sclerosis: Secondary | ICD-10-CM | POA: Diagnosis not present

## 2014-11-05 DIAGNOSIS — F329 Major depressive disorder, single episode, unspecified: Secondary | ICD-10-CM | POA: Diagnosis not present

## 2014-11-05 DIAGNOSIS — E782 Mixed hyperlipidemia: Secondary | ICD-10-CM | POA: Diagnosis not present

## 2014-11-05 DIAGNOSIS — E1165 Type 2 diabetes mellitus with hyperglycemia: Secondary | ICD-10-CM | POA: Diagnosis not present

## 2014-11-05 DIAGNOSIS — F411 Generalized anxiety disorder: Secondary | ICD-10-CM | POA: Diagnosis not present

## 2015-02-04 DIAGNOSIS — E782 Mixed hyperlipidemia: Secondary | ICD-10-CM | POA: Diagnosis not present

## 2015-02-04 DIAGNOSIS — R1013 Epigastric pain: Secondary | ICD-10-CM | POA: Diagnosis not present

## 2015-02-04 DIAGNOSIS — I10 Essential (primary) hypertension: Secondary | ICD-10-CM | POA: Diagnosis not present

## 2015-02-04 DIAGNOSIS — E1165 Type 2 diabetes mellitus with hyperglycemia: Secondary | ICD-10-CM | POA: Diagnosis not present

## 2015-02-11 DIAGNOSIS — E1165 Type 2 diabetes mellitus with hyperglycemia: Secondary | ICD-10-CM | POA: Diagnosis not present

## 2015-02-11 DIAGNOSIS — F329 Major depressive disorder, single episode, unspecified: Secondary | ICD-10-CM | POA: Diagnosis not present

## 2015-02-11 DIAGNOSIS — I10 Essential (primary) hypertension: Secondary | ICD-10-CM | POA: Diagnosis not present

## 2015-02-11 DIAGNOSIS — E782 Mixed hyperlipidemia: Secondary | ICD-10-CM | POA: Diagnosis not present

## 2015-02-11 DIAGNOSIS — F411 Generalized anxiety disorder: Secondary | ICD-10-CM | POA: Diagnosis not present

## 2015-02-11 DIAGNOSIS — G35 Multiple sclerosis: Secondary | ICD-10-CM | POA: Diagnosis not present

## 2015-02-26 DIAGNOSIS — F329 Major depressive disorder, single episode, unspecified: Secondary | ICD-10-CM | POA: Diagnosis not present

## 2015-02-26 DIAGNOSIS — G35 Multiple sclerosis: Secondary | ICD-10-CM | POA: Diagnosis not present

## 2015-02-26 DIAGNOSIS — I10 Essential (primary) hypertension: Secondary | ICD-10-CM | POA: Diagnosis not present

## 2015-02-26 DIAGNOSIS — F419 Anxiety disorder, unspecified: Secondary | ICD-10-CM | POA: Diagnosis not present

## 2015-03-14 DIAGNOSIS — G35 Multiple sclerosis: Secondary | ICD-10-CM | POA: Diagnosis not present

## 2015-04-29 DIAGNOSIS — E782 Mixed hyperlipidemia: Secondary | ICD-10-CM | POA: Diagnosis not present

## 2015-04-29 DIAGNOSIS — E1165 Type 2 diabetes mellitus with hyperglycemia: Secondary | ICD-10-CM | POA: Diagnosis not present

## 2015-04-29 DIAGNOSIS — F411 Generalized anxiety disorder: Secondary | ICD-10-CM | POA: Diagnosis not present

## 2015-04-29 DIAGNOSIS — I10 Essential (primary) hypertension: Secondary | ICD-10-CM | POA: Diagnosis not present

## 2015-05-06 DIAGNOSIS — F411 Generalized anxiety disorder: Secondary | ICD-10-CM | POA: Diagnosis not present

## 2015-05-06 DIAGNOSIS — I10 Essential (primary) hypertension: Secondary | ICD-10-CM | POA: Diagnosis not present

## 2015-05-06 DIAGNOSIS — F329 Major depressive disorder, single episode, unspecified: Secondary | ICD-10-CM | POA: Diagnosis not present

## 2015-05-06 DIAGNOSIS — G35 Multiple sclerosis: Secondary | ICD-10-CM | POA: Diagnosis not present

## 2015-05-06 DIAGNOSIS — E782 Mixed hyperlipidemia: Secondary | ICD-10-CM | POA: Diagnosis not present

## 2015-05-06 DIAGNOSIS — E1165 Type 2 diabetes mellitus with hyperglycemia: Secondary | ICD-10-CM | POA: Diagnosis not present

## 2015-08-05 DIAGNOSIS — I10 Essential (primary) hypertension: Secondary | ICD-10-CM | POA: Diagnosis not present

## 2015-08-05 DIAGNOSIS — E1165 Type 2 diabetes mellitus with hyperglycemia: Secondary | ICD-10-CM | POA: Diagnosis not present

## 2015-08-05 DIAGNOSIS — E782 Mixed hyperlipidemia: Secondary | ICD-10-CM | POA: Diagnosis not present

## 2015-08-19 DIAGNOSIS — E1165 Type 2 diabetes mellitus with hyperglycemia: Secondary | ICD-10-CM | POA: Diagnosis not present

## 2015-08-19 DIAGNOSIS — I10 Essential (primary) hypertension: Secondary | ICD-10-CM | POA: Diagnosis not present

## 2015-08-19 DIAGNOSIS — F329 Major depressive disorder, single episode, unspecified: Secondary | ICD-10-CM | POA: Diagnosis not present

## 2015-08-19 DIAGNOSIS — M79632 Pain in left forearm: Secondary | ICD-10-CM | POA: Diagnosis not present

## 2015-08-19 DIAGNOSIS — G35 Multiple sclerosis: Secondary | ICD-10-CM | POA: Diagnosis not present

## 2015-08-19 DIAGNOSIS — M25532 Pain in left wrist: Secondary | ICD-10-CM | POA: Diagnosis not present

## 2015-08-19 DIAGNOSIS — J019 Acute sinusitis, unspecified: Secondary | ICD-10-CM | POA: Diagnosis not present

## 2015-08-19 DIAGNOSIS — E782 Mixed hyperlipidemia: Secondary | ICD-10-CM | POA: Diagnosis not present

## 2015-08-19 DIAGNOSIS — F411 Generalized anxiety disorder: Secondary | ICD-10-CM | POA: Diagnosis not present

## 2015-08-20 ENCOUNTER — Other Ambulatory Visit (HOSPITAL_COMMUNITY): Payer: Self-pay | Admitting: Family Medicine

## 2015-08-20 DIAGNOSIS — M25532 Pain in left wrist: Secondary | ICD-10-CM

## 2015-08-29 ENCOUNTER — Ambulatory Visit (HOSPITAL_COMMUNITY): Payer: Self-pay

## 2015-09-04 ENCOUNTER — Ambulatory Visit (HOSPITAL_COMMUNITY): Payer: Self-pay

## 2015-09-10 ENCOUNTER — Telehealth: Payer: Self-pay | Admitting: Orthopedic Surgery

## 2015-09-10 NOTE — Telephone Encounter (Signed)
Patient aware of scheduled referral appointment with Dr Ansel[facility], 10/14/15, 10:50a.m.

## 2015-10-14 ENCOUNTER — Ambulatory Visit (INDEPENDENT_AMBULATORY_CARE_PROVIDER_SITE_OTHER): Payer: Commercial Managed Care - HMO | Admitting: Orthopedic Surgery

## 2015-10-14 ENCOUNTER — Encounter: Payer: Self-pay | Admitting: Orthopedic Surgery

## 2015-10-14 VITALS — BP 171/109 | Ht 65.5 in | Wt 211.0 lb

## 2015-10-14 DIAGNOSIS — M92212 Osteochondrosis (juvenile) of carpal lunate [Kienbock], left hand: Secondary | ICD-10-CM | POA: Diagnosis not present

## 2015-10-14 MED ORDER — ALPRAZOLAM 0.5 MG PO TABS: ORAL_TABLET | ORAL | Status: DC

## 2015-10-14 NOTE — Progress Notes (Signed)
Patient ID: Amy Horton, female   DOB: 1970/12/02, 46 y.o.   MRN: [id number]  Chief Complaint  Patient presents with  . Wrist Pain    LEFT WRIST PAIN    HPI Amy Horton is a 45 y.o. female.  Presents after 10 years ago falling down some steps she has a history of multiple sclerosis. She was treated for sprain at the time but since that time has had multiple flareups which include swelling and pain in the left wrist. When her wrist is symptomatic she complains of throbbing aching 8 out of 10 pain.  She was not treated really with any other immobilization devices are medication although she's taken some over-the-counter Tylenol and ibuprofen. She notices that she has difficulty carrying things in her left hand although that is the left side which is weak from the multiple sclerosis  Terms of her review of systems she complains of depression anxiety limb pain muscle weakness numbness tingling burning pain in her legs dizziness weakness and lightheadedness the neurologic symptoms are related to the MS  Review of Systems Review of Systems Please see the above history   Past Medical History  Diagnosis Date  . Multiple sclerosis [facility]) March 1997  . Headache(784.0)   . Anxiety and depression     Past Surgical History  Procedure Laterality Date  . Cholecystectomy  2002  . Cesarean section  [id number]    x2  . Breast biopsy      teenager  . Tympanostomy tube placement      x2 as a child    No family history on file.  Social History Social History  Substance Use Topics  . Smoking status: Never Smoker   . Smokeless tobacco: Never Used  . Alcohol Use: No    Allergies  Allergen Reactions  . Codeine     Current Outpatient Prescriptions  Medication Sig Dispense Refill  . FLUoxetine (PROZAC) 40 MG capsule Take 40 mg by mouth daily.    Marland Kitchen losartan (COZAAR) 50 MG tablet Take 50 mg by mouth daily.     No current facility-administered medications for this visit.        Physical Exam Physical Exam Blood pressure 171/109, height 5' 5.5" (1.664 m), weight 211 lb (95.709 kg). Appearance, there are no abnormalities in terms of appearance the patient was well-developed and well-nourished. The grooming and hygiene were normal.  Mental status orientation, there was normal alertness and orientation Mood pleasant Ambulatory status normal with no assistive devices  Examination of the left wrist Inspection swelling of the left wrist joint is noted decreased flexion is noted extension is normal. No instability was reviewed was noted on instability testing she has weak grip strength on the left skin is warm dry and intact without laceration ulceration or erythema. 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 There are some mild surrounding bony changes making this a stage II disease, the joint seems to be level  Assessment  Kienbock's disease   Plan  MRI left wrist

## 2015-10-14 NOTE — Patient Instructions (Signed)
MRI ORDERED. We will contact your insurance company for pre-certification. After we receive that, we will schedule you an appointment for the MRI and contact you. If you have not heard from our office in one week, contact us.     

## 2015-10-23 ENCOUNTER — Telehealth: Payer: Self-pay | Admitting: Orthopedic Surgery

## 2015-10-23 NOTE — Telephone Encounter (Addendum)
Contacted [employer] via online portal regarding MRI CPT [id number]; in review and on hold/reference#[id number] * * Also spoke with representative Jonetta on phone, to verify CPT code and description, which is for upper extremity (left wrist), without contrast; description on portal did not match, but has since been updated and verified 10/24/15.

## 2015-10-31 DIAGNOSIS — J209 Acute bronchitis, unspecified: Secondary | ICD-10-CM | POA: Diagnosis not present

## 2015-10-31 DIAGNOSIS — J019 Acute sinusitis, unspecified: Secondary | ICD-10-CM | POA: Diagnosis not present

## 2015-11-04 ENCOUNTER — Ambulatory Visit (HOSPITAL_COMMUNITY)
Admission: RE | Admit: 2015-11-04 | Discharge: 2015-11-04 | Disposition: A | Discharge status: Home / Self Care | Payer: Commercial Managed Care - HMO | Source: Ambulatory Visit | Attending: Orthopedic Surgery | Admitting: Orthopedic Surgery

## 2015-11-04 DIAGNOSIS — M25532 Pain in left wrist: Secondary | ICD-10-CM | POA: Diagnosis not present

## 2015-11-04 DIAGNOSIS — M92212 Osteochondrosis (juvenile) of carpal lunate [Kienbock], left hand: Secondary | ICD-10-CM | POA: Insufficient documentation

## 2015-11-06 NOTE — Telephone Encounter (Signed)
Per referral notes - MRI approved, [id number]; appointment 11/04/15 at [facility]; patient aware, and aware of follow up visit here.

## 2015-11-11 ENCOUNTER — Telehealth: Payer: Self-pay | Admitting: *Deleted

## 2015-11-11 ENCOUNTER — Ambulatory Visit (INDEPENDENT_AMBULATORY_CARE_PROVIDER_SITE_OTHER): Payer: Commercial Managed Care - HMO | Admitting: Orthopedic Surgery

## 2015-11-11 DIAGNOSIS — M92212 Osteochondrosis (juvenile) of carpal lunate [Kienbock], left hand: Secondary | ICD-10-CM

## 2015-11-11 NOTE — Progress Notes (Signed)
Follow-up visit after MRI status post MRI left wrist for evaluation of possible KIENBOCKS disease  MRI report was reviewed and reviewed with images as follows  IMPRESSION: 1. Cystic changes and fragmentation of the lunate with partial collapse and areas of low signal most consistent with Kienbock's disease.     Electronically Signed   By: Kathreen Devoid   On: 11/04/2015 15:19     MRI shows cystic changes and fragmentation of the lunate with partial collapse consistent with KIENBOCKS disease  The patient will be referred to Dr. Veronia Beets for further evaluation and treatment

## 2015-11-11 NOTE — Telephone Encounter (Signed)
Faxed referral and office notes to [facility]. Awaiting appointment.

## 2015-11-11 NOTE — Addendum Note (Signed)
Addended by: Moreen Fowler R on: 11/11/2015 12:01 PM   Modules accepted: Orders

## 2015-11-24 NOTE — Telephone Encounter (Signed)
Appointment with Dr Amedeo Plenty 12/10/15 12:30

## 2015-11-25 DIAGNOSIS — E1165 Type 2 diabetes mellitus with hyperglycemia: Secondary | ICD-10-CM | POA: Diagnosis not present

## 2015-11-25 DIAGNOSIS — I10 Essential (primary) hypertension: Secondary | ICD-10-CM | POA: Diagnosis not present

## 2015-11-25 DIAGNOSIS — E782 Mixed hyperlipidemia: Secondary | ICD-10-CM | POA: Diagnosis not present

## 2015-12-02 DIAGNOSIS — I10 Essential (primary) hypertension: Secondary | ICD-10-CM | POA: Diagnosis not present

## 2015-12-02 DIAGNOSIS — F411 Generalized anxiety disorder: Secondary | ICD-10-CM | POA: Diagnosis not present

## 2015-12-02 DIAGNOSIS — E782 Mixed hyperlipidemia: Secondary | ICD-10-CM | POA: Diagnosis not present

## 2015-12-02 DIAGNOSIS — F329 Major depressive disorder, single episode, unspecified: Secondary | ICD-10-CM | POA: Diagnosis not present

## 2015-12-02 DIAGNOSIS — E1165 Type 2 diabetes mellitus with hyperglycemia: Secondary | ICD-10-CM | POA: Diagnosis not present

## 2015-12-02 DIAGNOSIS — M25532 Pain in left wrist: Secondary | ICD-10-CM | POA: Diagnosis not present

## 2015-12-02 DIAGNOSIS — G35 Multiple sclerosis: Secondary | ICD-10-CM | POA: Diagnosis not present

## 2015-12-08 ENCOUNTER — Telehealth: Payer: Self-pay | Admitting: Orthopedic Surgery

## 2015-12-08 NOTE — Telephone Encounter (Signed)
Per Maudie Mercury at [place] this was taken care of today

## 2015-12-08 NOTE — Telephone Encounter (Signed)
A lady from [place] called and said that Amy Horton has an appointment with Dr. Amedeo Plenty on Wednesday, 12-10-15 at 12:30.  However, this patient needs a [employer] Referral from their PCP([facility] in [place]).  She said that we need to obtain this and please call her as soon as possible at [phone] ext. [phone] with the authorization.  She said if she does not have this pretty quick, they will have to reschedule this patient's appointment.

## 2015-12-10 DIAGNOSIS — M92212 Osteochondrosis (juvenile) of carpal lunate [Kienbock], left hand: Secondary | ICD-10-CM | POA: Diagnosis not present

## 2016-01-02 DIAGNOSIS — G35 Multiple sclerosis: Secondary | ICD-10-CM | POA: Diagnosis not present

## 2016-01-02 DIAGNOSIS — I10 Essential (primary) hypertension: Secondary | ICD-10-CM | POA: Diagnosis not present

## 2016-01-02 DIAGNOSIS — M25532 Pain in left wrist: Secondary | ICD-10-CM | POA: Diagnosis not present

## 2016-01-02 DIAGNOSIS — E1165 Type 2 diabetes mellitus with hyperglycemia: Secondary | ICD-10-CM | POA: Diagnosis not present

## 2016-01-02 DIAGNOSIS — F329 Major depressive disorder, single episode, unspecified: Secondary | ICD-10-CM | POA: Diagnosis not present

## 2016-01-02 DIAGNOSIS — E782 Mixed hyperlipidemia: Secondary | ICD-10-CM | POA: Diagnosis not present

## 2016-01-02 DIAGNOSIS — F411 Generalized anxiety disorder: Secondary | ICD-10-CM | POA: Diagnosis not present

## 2016-01-22 DIAGNOSIS — I10 Essential (primary) hypertension: Secondary | ICD-10-CM | POA: Diagnosis not present

## 2016-01-22 DIAGNOSIS — G35 Multiple sclerosis: Secondary | ICD-10-CM | POA: Diagnosis not present

## 2016-01-22 DIAGNOSIS — F411 Generalized anxiety disorder: Secondary | ICD-10-CM | POA: Diagnosis not present

## 2016-01-27 DIAGNOSIS — M92212 Osteochondrosis (juvenile) of carpal lunate [Kienbock], left hand: Secondary | ICD-10-CM | POA: Diagnosis not present

## 2016-01-27 DIAGNOSIS — G8918 Other acute postprocedural pain: Secondary | ICD-10-CM | POA: Diagnosis not present

## 2016-01-27 DIAGNOSIS — M931 Kienbock's disease of adults: Secondary | ICD-10-CM | POA: Diagnosis not present

## 2016-02-04 DIAGNOSIS — I10 Essential (primary) hypertension: Secondary | ICD-10-CM | POA: Diagnosis not present

## 2016-02-04 DIAGNOSIS — F329 Major depressive disorder, single episode, unspecified: Secondary | ICD-10-CM | POA: Diagnosis not present

## 2016-02-04 DIAGNOSIS — F411 Generalized anxiety disorder: Secondary | ICD-10-CM | POA: Diagnosis not present

## 2016-02-06 DIAGNOSIS — M92212 Osteochondrosis (juvenile) of carpal lunate [Kienbock], left hand: Secondary | ICD-10-CM | POA: Diagnosis not present

## 2016-02-10 DIAGNOSIS — Z4789 Encounter for other orthopedic aftercare: Secondary | ICD-10-CM | POA: Diagnosis not present

## 2016-02-10 DIAGNOSIS — M92212 Osteochondrosis (juvenile) of carpal lunate [Kienbock], left hand: Secondary | ICD-10-CM | POA: Diagnosis not present

## 2016-02-25 DIAGNOSIS — Z4789 Encounter for other orthopedic aftercare: Secondary | ICD-10-CM | POA: Diagnosis not present

## 2016-02-25 DIAGNOSIS — M92212 Osteochondrosis (juvenile) of carpal lunate [Kienbock], left hand: Secondary | ICD-10-CM | POA: Diagnosis not present

## 2016-03-02 DIAGNOSIS — M92212 Osteochondrosis (juvenile) of carpal lunate [Kienbock], left hand: Secondary | ICD-10-CM | POA: Diagnosis not present

## 2016-03-02 DIAGNOSIS — E782 Mixed hyperlipidemia: Secondary | ICD-10-CM | POA: Diagnosis not present

## 2016-03-02 DIAGNOSIS — I10 Essential (primary) hypertension: Secondary | ICD-10-CM | POA: Diagnosis not present

## 2016-03-02 DIAGNOSIS — E1165 Type 2 diabetes mellitus with hyperglycemia: Secondary | ICD-10-CM | POA: Diagnosis not present

## 2016-03-09 DIAGNOSIS — H6983 Other specified disorders of Eustachian tube, bilateral: Secondary | ICD-10-CM | POA: Diagnosis not present

## 2016-03-09 DIAGNOSIS — F411 Generalized anxiety disorder: Secondary | ICD-10-CM | POA: Diagnosis not present

## 2016-03-09 DIAGNOSIS — Z1389 Encounter for screening for other disorder: Secondary | ICD-10-CM | POA: Diagnosis not present

## 2016-03-09 DIAGNOSIS — E1165 Type 2 diabetes mellitus with hyperglycemia: Secondary | ICD-10-CM | POA: Diagnosis not present

## 2016-03-09 DIAGNOSIS — F329 Major depressive disorder, single episode, unspecified: Secondary | ICD-10-CM | POA: Diagnosis not present

## 2016-03-09 DIAGNOSIS — G35 Multiple sclerosis: Secondary | ICD-10-CM | POA: Diagnosis not present

## 2016-03-09 DIAGNOSIS — Z6834 Body mass index (BMI) 34.0-34.9, adult: Secondary | ICD-10-CM | POA: Diagnosis not present

## 2016-03-09 DIAGNOSIS — I10 Essential (primary) hypertension: Secondary | ICD-10-CM | POA: Diagnosis not present

## 2016-03-09 DIAGNOSIS — M92212 Osteochondrosis (juvenile) of carpal lunate [Kienbock], left hand: Secondary | ICD-10-CM | POA: Diagnosis not present

## 2016-03-17 DIAGNOSIS — M92212 Osteochondrosis (juvenile) of carpal lunate [Kienbock], left hand: Secondary | ICD-10-CM | POA: Diagnosis not present

## 2016-03-19 DIAGNOSIS — H5210 Myopia, unspecified eye: Secondary | ICD-10-CM | POA: Diagnosis not present

## 2016-03-19 DIAGNOSIS — H5213 Myopia, bilateral: Secondary | ICD-10-CM | POA: Diagnosis not present

## 2016-03-29 DIAGNOSIS — Z4789 Encounter for other orthopedic aftercare: Secondary | ICD-10-CM | POA: Diagnosis not present

## 2016-03-29 DIAGNOSIS — M92212 Osteochondrosis (juvenile) of carpal lunate [Kienbock], left hand: Secondary | ICD-10-CM | POA: Diagnosis not present

## 2016-04-13 DIAGNOSIS — M92212 Osteochondrosis (juvenile) of carpal lunate [Kienbock], left hand: Secondary | ICD-10-CM | POA: Diagnosis not present

## 2016-05-03 DIAGNOSIS — H6691 Otitis media, unspecified, right ear: Secondary | ICD-10-CM | POA: Diagnosis not present

## 2016-05-03 DIAGNOSIS — Z6835 Body mass index (BMI) 35.0-35.9, adult: Secondary | ICD-10-CM | POA: Diagnosis not present

## 2016-05-03 DIAGNOSIS — H6981 Other specified disorders of Eustachian tube, right ear: Secondary | ICD-10-CM | POA: Diagnosis not present

## 2016-05-03 DIAGNOSIS — I10 Essential (primary) hypertension: Secondary | ICD-10-CM | POA: Diagnosis not present

## 2016-05-17 ENCOUNTER — Ambulatory Visit (INDEPENDENT_AMBULATORY_CARE_PROVIDER_SITE_OTHER): Payer: Commercial Managed Care - HMO | Admitting: Otolaryngology

## 2016-05-17 DIAGNOSIS — H6983 Other specified disorders of Eustachian tube, bilateral: Secondary | ICD-10-CM | POA: Diagnosis not present

## 2016-05-17 DIAGNOSIS — H903 Sensorineural hearing loss, bilateral: Secondary | ICD-10-CM | POA: Diagnosis not present

## 2016-05-17 DIAGNOSIS — H9201 Otalgia, right ear: Secondary | ICD-10-CM | POA: Diagnosis not present

## 2016-05-25 DIAGNOSIS — M92212 Osteochondrosis (juvenile) of carpal lunate [Kienbock], left hand: Secondary | ICD-10-CM | POA: Diagnosis not present

## 2016-05-25 DIAGNOSIS — Z4789 Encounter for other orthopedic aftercare: Secondary | ICD-10-CM | POA: Diagnosis not present

## 2016-05-31 DIAGNOSIS — H6981 Other specified disorders of Eustachian tube, right ear: Secondary | ICD-10-CM | POA: Diagnosis not present

## 2016-05-31 DIAGNOSIS — H8111 Benign paroxysmal vertigo, right ear: Secondary | ICD-10-CM | POA: Diagnosis not present

## 2016-05-31 DIAGNOSIS — Z6835 Body mass index (BMI) 35.0-35.9, adult: Secondary | ICD-10-CM | POA: Diagnosis not present

## 2016-06-01 DIAGNOSIS — I10 Essential (primary) hypertension: Secondary | ICD-10-CM | POA: Diagnosis not present

## 2016-06-01 DIAGNOSIS — E782 Mixed hyperlipidemia: Secondary | ICD-10-CM | POA: Diagnosis not present

## 2016-06-01 DIAGNOSIS — R739 Hyperglycemia, unspecified: Secondary | ICD-10-CM | POA: Diagnosis not present

## 2016-06-01 DIAGNOSIS — E1165 Type 2 diabetes mellitus with hyperglycemia: Secondary | ICD-10-CM | POA: Diagnosis not present

## 2016-06-01 DIAGNOSIS — F411 Generalized anxiety disorder: Secondary | ICD-10-CM | POA: Diagnosis not present

## 2016-06-08 DIAGNOSIS — G35 Multiple sclerosis: Secondary | ICD-10-CM | POA: Diagnosis not present

## 2016-06-08 DIAGNOSIS — H6983 Other specified disorders of Eustachian tube, bilateral: Secondary | ICD-10-CM | POA: Diagnosis not present

## 2016-06-08 DIAGNOSIS — I10 Essential (primary) hypertension: Secondary | ICD-10-CM | POA: Diagnosis not present

## 2016-06-08 DIAGNOSIS — E1165 Type 2 diabetes mellitus with hyperglycemia: Secondary | ICD-10-CM | POA: Diagnosis not present

## 2016-06-08 DIAGNOSIS — F411 Generalized anxiety disorder: Secondary | ICD-10-CM | POA: Diagnosis not present

## 2016-06-08 DIAGNOSIS — F329 Major depressive disorder, single episode, unspecified: Secondary | ICD-10-CM | POA: Diagnosis not present

## 2016-06-08 DIAGNOSIS — Z6836 Body mass index (BMI) 36.0-36.9, adult: Secondary | ICD-10-CM | POA: Diagnosis not present

## 2016-07-12 ENCOUNTER — Ambulatory Visit (INDEPENDENT_AMBULATORY_CARE_PROVIDER_SITE_OTHER): Payer: Commercial Managed Care - HMO | Admitting: Otolaryngology

## 2016-07-12 DIAGNOSIS — H6983 Other specified disorders of Eustachian tube, bilateral: Secondary | ICD-10-CM

## 2016-07-12 DIAGNOSIS — H9209 Otalgia, unspecified ear: Secondary | ICD-10-CM | POA: Diagnosis not present

## 2016-08-13 DIAGNOSIS — J019 Acute sinusitis, unspecified: Secondary | ICD-10-CM | POA: Diagnosis not present

## 2016-08-13 DIAGNOSIS — Z6835 Body mass index (BMI) 35.0-35.9, adult: Secondary | ICD-10-CM | POA: Diagnosis not present

## 2016-08-13 DIAGNOSIS — J209 Acute bronchitis, unspecified: Secondary | ICD-10-CM | POA: Diagnosis not present

## 2016-10-05 DIAGNOSIS — I10 Essential (primary) hypertension: Secondary | ICD-10-CM | POA: Diagnosis not present

## 2016-10-05 DIAGNOSIS — E782 Mixed hyperlipidemia: Secondary | ICD-10-CM | POA: Diagnosis not present

## 2016-10-05 DIAGNOSIS — F329 Major depressive disorder, single episode, unspecified: Secondary | ICD-10-CM | POA: Diagnosis not present

## 2016-10-05 DIAGNOSIS — E1165 Type 2 diabetes mellitus with hyperglycemia: Secondary | ICD-10-CM | POA: Diagnosis not present

## 2016-10-08 DIAGNOSIS — Z9049 Acquired absence of other specified parts of digestive tract: Secondary | ICD-10-CM | POA: Diagnosis not present

## 2016-10-08 DIAGNOSIS — N390 Urinary tract infection, site not specified: Secondary | ICD-10-CM | POA: Diagnosis not present

## 2016-10-08 DIAGNOSIS — R109 Unspecified abdominal pain: Secondary | ICD-10-CM | POA: Diagnosis not present

## 2016-10-08 DIAGNOSIS — Z87442 Personal history of urinary calculi: Secondary | ICD-10-CM | POA: Diagnosis not present

## 2016-10-08 DIAGNOSIS — R1011 Right upper quadrant pain: Secondary | ICD-10-CM | POA: Diagnosis not present

## 2016-10-08 DIAGNOSIS — K760 Fatty (change of) liver, not elsewhere classified: Secondary | ICD-10-CM | POA: Diagnosis not present

## 2016-10-08 DIAGNOSIS — G35 Multiple sclerosis: Secondary | ICD-10-CM | POA: Diagnosis not present

## 2016-10-08 DIAGNOSIS — R739 Hyperglycemia, unspecified: Secondary | ICD-10-CM | POA: Diagnosis not present

## 2016-10-08 DIAGNOSIS — I10 Essential (primary) hypertension: Secondary | ICD-10-CM | POA: Diagnosis not present

## 2016-10-08 DIAGNOSIS — R161 Splenomegaly, not elsewhere classified: Secondary | ICD-10-CM | POA: Diagnosis not present

## 2016-10-08 DIAGNOSIS — Z79899 Other long term (current) drug therapy: Secondary | ICD-10-CM | POA: Diagnosis not present

## 2016-10-12 DIAGNOSIS — I10 Essential (primary) hypertension: Secondary | ICD-10-CM | POA: Diagnosis not present

## 2016-10-12 DIAGNOSIS — H6983 Other specified disorders of Eustachian tube, bilateral: Secondary | ICD-10-CM | POA: Diagnosis not present

## 2016-10-12 DIAGNOSIS — F411 Generalized anxiety disorder: Secondary | ICD-10-CM | POA: Diagnosis not present

## 2016-10-12 DIAGNOSIS — Z6835 Body mass index (BMI) 35.0-35.9, adult: Secondary | ICD-10-CM | POA: Diagnosis not present

## 2016-10-12 DIAGNOSIS — F329 Major depressive disorder, single episode, unspecified: Secondary | ICD-10-CM | POA: Diagnosis not present

## 2016-10-12 DIAGNOSIS — E1165 Type 2 diabetes mellitus with hyperglycemia: Secondary | ICD-10-CM | POA: Diagnosis not present

## 2016-10-12 DIAGNOSIS — G35 Multiple sclerosis: Secondary | ICD-10-CM | POA: Diagnosis not present

## 2017-01-04 DIAGNOSIS — E782 Mixed hyperlipidemia: Secondary | ICD-10-CM | POA: Diagnosis not present

## 2017-01-04 DIAGNOSIS — I10 Essential (primary) hypertension: Secondary | ICD-10-CM | POA: Diagnosis not present

## 2017-01-04 DIAGNOSIS — F329 Major depressive disorder, single episode, unspecified: Secondary | ICD-10-CM | POA: Diagnosis not present

## 2017-01-04 DIAGNOSIS — E1165 Type 2 diabetes mellitus with hyperglycemia: Secondary | ICD-10-CM | POA: Diagnosis not present

## 2017-01-11 DIAGNOSIS — H6983 Other specified disorders of Eustachian tube, bilateral: Secondary | ICD-10-CM | POA: Diagnosis not present

## 2017-01-11 DIAGNOSIS — I10 Essential (primary) hypertension: Secondary | ICD-10-CM | POA: Diagnosis not present

## 2017-01-11 DIAGNOSIS — F411 Generalized anxiety disorder: Secondary | ICD-10-CM | POA: Diagnosis not present

## 2017-01-11 DIAGNOSIS — F329 Major depressive disorder, single episode, unspecified: Secondary | ICD-10-CM | POA: Diagnosis not present

## 2017-01-11 DIAGNOSIS — E1165 Type 2 diabetes mellitus with hyperglycemia: Secondary | ICD-10-CM | POA: Diagnosis not present

## 2017-01-11 DIAGNOSIS — Z6835 Body mass index (BMI) 35.0-35.9, adult: Secondary | ICD-10-CM | POA: Diagnosis not present

## 2017-01-11 DIAGNOSIS — G35 Multiple sclerosis: Secondary | ICD-10-CM | POA: Diagnosis not present

## 2017-02-09 DIAGNOSIS — H6981 Other specified disorders of Eustachian tube, right ear: Secondary | ICD-10-CM | POA: Diagnosis not present

## 2017-02-09 DIAGNOSIS — Z6833 Body mass index (BMI) 33.0-33.9, adult: Secondary | ICD-10-CM | POA: Diagnosis not present

## 2017-02-09 DIAGNOSIS — R51 Headache: Secondary | ICD-10-CM | POA: Diagnosis not present

## 2017-03-14 DIAGNOSIS — R300 Dysuria: Secondary | ICD-10-CM | POA: Diagnosis not present

## 2017-03-14 DIAGNOSIS — N3000 Acute cystitis without hematuria: Secondary | ICD-10-CM | POA: Diagnosis not present

## 2017-03-14 DIAGNOSIS — Z6834 Body mass index (BMI) 34.0-34.9, adult: Secondary | ICD-10-CM | POA: Diagnosis not present

## 2017-03-22 DIAGNOSIS — G35 Multiple sclerosis: Secondary | ICD-10-CM | POA: Diagnosis not present

## 2017-03-22 DIAGNOSIS — E1165 Type 2 diabetes mellitus with hyperglycemia: Secondary | ICD-10-CM | POA: Diagnosis not present

## 2017-03-22 DIAGNOSIS — I10 Essential (primary) hypertension: Secondary | ICD-10-CM | POA: Diagnosis not present

## 2017-03-22 DIAGNOSIS — F411 Generalized anxiety disorder: Secondary | ICD-10-CM | POA: Diagnosis not present

## 2017-03-22 DIAGNOSIS — F329 Major depressive disorder, single episode, unspecified: Secondary | ICD-10-CM | POA: Diagnosis not present

## 2017-03-22 DIAGNOSIS — H6983 Other specified disorders of Eustachian tube, bilateral: Secondary | ICD-10-CM | POA: Diagnosis not present

## 2017-03-22 DIAGNOSIS — Z6833 Body mass index (BMI) 33.0-33.9, adult: Secondary | ICD-10-CM | POA: Diagnosis not present

## 2017-04-05 DIAGNOSIS — F411 Generalized anxiety disorder: Secondary | ICD-10-CM | POA: Diagnosis not present

## 2017-04-05 DIAGNOSIS — H6981 Other specified disorders of Eustachian tube, right ear: Secondary | ICD-10-CM | POA: Diagnosis not present

## 2017-04-05 DIAGNOSIS — F329 Major depressive disorder, single episode, unspecified: Secondary | ICD-10-CM | POA: Diagnosis not present

## 2017-04-05 DIAGNOSIS — E782 Mixed hyperlipidemia: Secondary | ICD-10-CM | POA: Diagnosis not present

## 2017-04-05 DIAGNOSIS — Z6833 Body mass index (BMI) 33.0-33.9, adult: Secondary | ICD-10-CM | POA: Diagnosis not present

## 2017-04-07 DIAGNOSIS — H903 Sensorineural hearing loss, bilateral: Secondary | ICD-10-CM | POA: Diagnosis not present

## 2017-04-07 DIAGNOSIS — M792 Neuralgia and neuritis, unspecified: Secondary | ICD-10-CM | POA: Diagnosis not present

## 2017-04-07 DIAGNOSIS — G43109 Migraine with aura, not intractable, without status migrainosus: Secondary | ICD-10-CM | POA: Diagnosis not present

## 2017-06-14 DIAGNOSIS — E1165 Type 2 diabetes mellitus with hyperglycemia: Secondary | ICD-10-CM | POA: Diagnosis not present

## 2017-06-14 DIAGNOSIS — F329 Major depressive disorder, single episode, unspecified: Secondary | ICD-10-CM | POA: Diagnosis not present

## 2017-06-14 DIAGNOSIS — E782 Mixed hyperlipidemia: Secondary | ICD-10-CM | POA: Diagnosis not present

## 2017-06-14 DIAGNOSIS — I10 Essential (primary) hypertension: Secondary | ICD-10-CM | POA: Diagnosis not present

## 2017-06-21 DIAGNOSIS — E782 Mixed hyperlipidemia: Secondary | ICD-10-CM | POA: Diagnosis not present

## 2017-06-21 DIAGNOSIS — G43909 Migraine, unspecified, not intractable, without status migrainosus: Secondary | ICD-10-CM | POA: Diagnosis not present

## 2017-06-21 DIAGNOSIS — Z1389 Encounter for screening for other disorder: Secondary | ICD-10-CM | POA: Diagnosis not present

## 2017-06-21 DIAGNOSIS — F329 Major depressive disorder, single episode, unspecified: Secondary | ICD-10-CM | POA: Diagnosis not present

## 2017-06-21 DIAGNOSIS — F411 Generalized anxiety disorder: Secondary | ICD-10-CM | POA: Diagnosis not present

## 2017-06-21 DIAGNOSIS — Z6833 Body mass index (BMI) 33.0-33.9, adult: Secondary | ICD-10-CM | POA: Diagnosis not present

## 2017-06-21 DIAGNOSIS — E876 Hypokalemia: Secondary | ICD-10-CM | POA: Diagnosis not present

## 2017-06-29 DIAGNOSIS — G35 Multiple sclerosis: Secondary | ICD-10-CM | POA: Diagnosis not present

## 2017-06-29 DIAGNOSIS — E782 Mixed hyperlipidemia: Secondary | ICD-10-CM | POA: Diagnosis not present

## 2017-06-29 DIAGNOSIS — E876 Hypokalemia: Secondary | ICD-10-CM | POA: Diagnosis not present

## 2017-06-29 DIAGNOSIS — F329 Major depressive disorder, single episode, unspecified: Secondary | ICD-10-CM | POA: Diagnosis not present

## 2017-06-29 DIAGNOSIS — I10 Essential (primary) hypertension: Secondary | ICD-10-CM | POA: Diagnosis not present

## 2017-06-29 DIAGNOSIS — E1165 Type 2 diabetes mellitus with hyperglycemia: Secondary | ICD-10-CM | POA: Diagnosis not present

## 2017-08-01 DIAGNOSIS — M25522 Pain in left elbow: Secondary | ICD-10-CM | POA: Diagnosis not present

## 2017-08-01 DIAGNOSIS — G35 Multiple sclerosis: Secondary | ICD-10-CM | POA: Diagnosis not present

## 2017-08-01 DIAGNOSIS — Z6832 Body mass index (BMI) 32.0-32.9, adult: Secondary | ICD-10-CM | POA: Diagnosis not present

## 2017-08-11 DIAGNOSIS — E876 Hypokalemia: Secondary | ICD-10-CM | POA: Diagnosis not present

## 2017-08-18 DIAGNOSIS — Z6835 Body mass index (BMI) 35.0-35.9, adult: Secondary | ICD-10-CM | POA: Diagnosis not present

## 2017-08-18 DIAGNOSIS — H6090 Unspecified otitis externa, unspecified ear: Secondary | ICD-10-CM | POA: Diagnosis not present

## 2017-08-18 DIAGNOSIS — E876 Hypokalemia: Secondary | ICD-10-CM | POA: Diagnosis not present

## 2017-08-24 DIAGNOSIS — E876 Hypokalemia: Secondary | ICD-10-CM | POA: Diagnosis not present

## 2017-09-01 DIAGNOSIS — E876 Hypokalemia: Secondary | ICD-10-CM | POA: Diagnosis not present

## 2017-09-01 DIAGNOSIS — E782 Mixed hyperlipidemia: Secondary | ICD-10-CM | POA: Diagnosis not present

## 2017-09-02 DIAGNOSIS — Z6832 Body mass index (BMI) 32.0-32.9, adult: Secondary | ICD-10-CM | POA: Diagnosis not present

## 2017-09-02 DIAGNOSIS — H6983 Other specified disorders of Eustachian tube, bilateral: Secondary | ICD-10-CM | POA: Diagnosis not present

## 2017-09-02 DIAGNOSIS — R197 Diarrhea, unspecified: Secondary | ICD-10-CM | POA: Diagnosis not present

## 2017-09-02 DIAGNOSIS — E876 Hypokalemia: Secondary | ICD-10-CM | POA: Diagnosis not present

## 2017-09-09 IMAGING — MR MR WRIST*L* W/O CM
5 of 7 series · 29 of 40 positions shown · non-contrast
Comparison: 08/25/2010

CLINICAL DATA: Left wrist pain for 10 years.

EXAM:
MR OF THE LEFT WRIST WITHOUT CONTRAST
TECHNIQUE: Multiplanar, multisequence MR imaging of the left wrist was
performed. No intravenous contrast was administered.

[Series 11: pdfs axial · axial · 3.0mm · 0.18mm/px · z∈[-75,-2]mm · 7 of 24 slices shown]
[im 1/24]
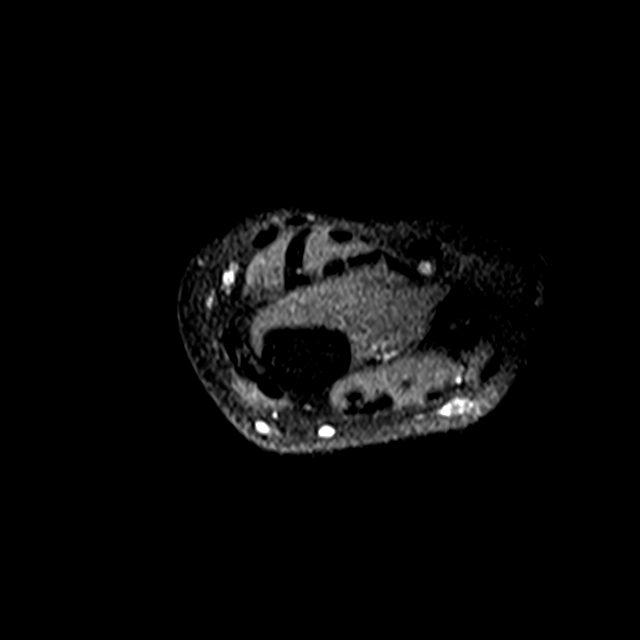
[im 4/24]
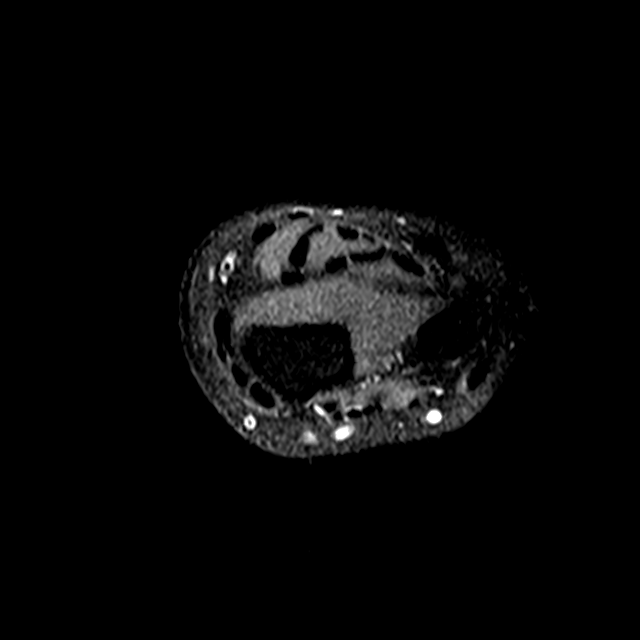
[im 8/24]
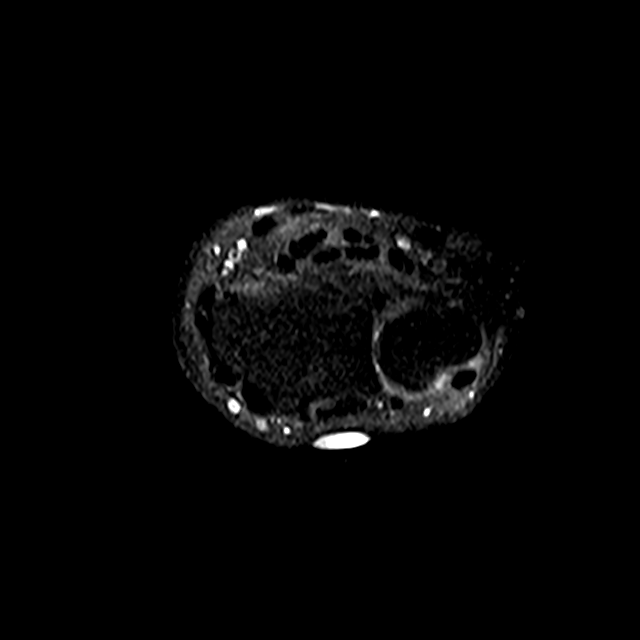
[im 12/24]
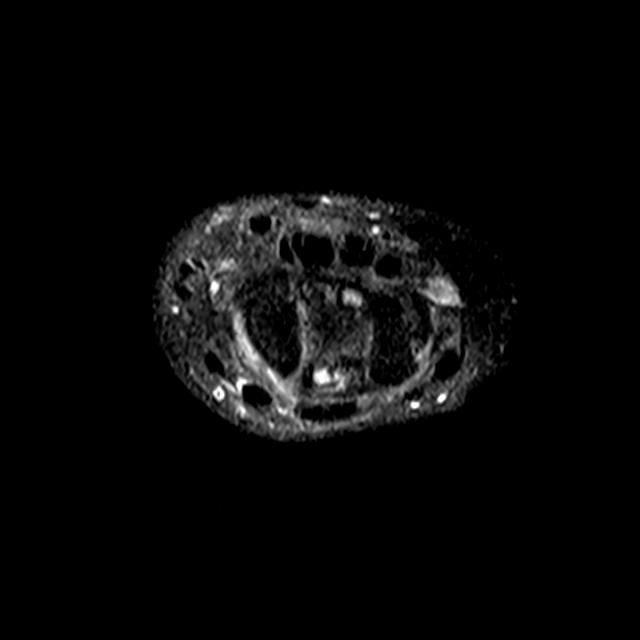
[im 16/24]
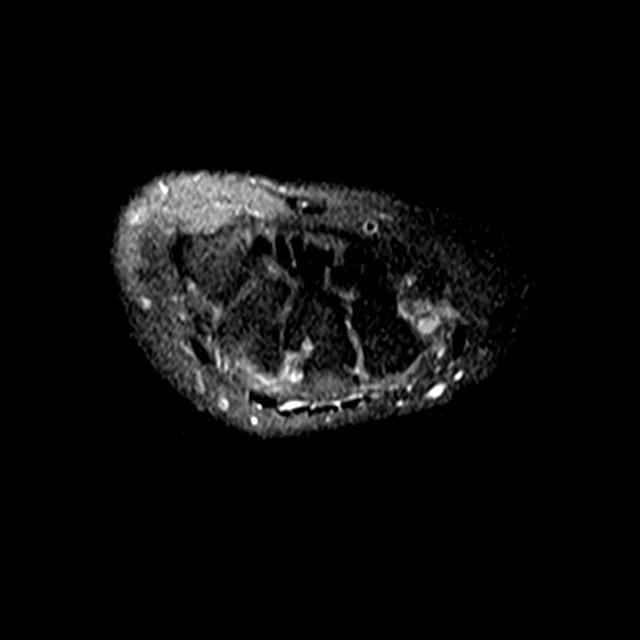
[im 20/24]
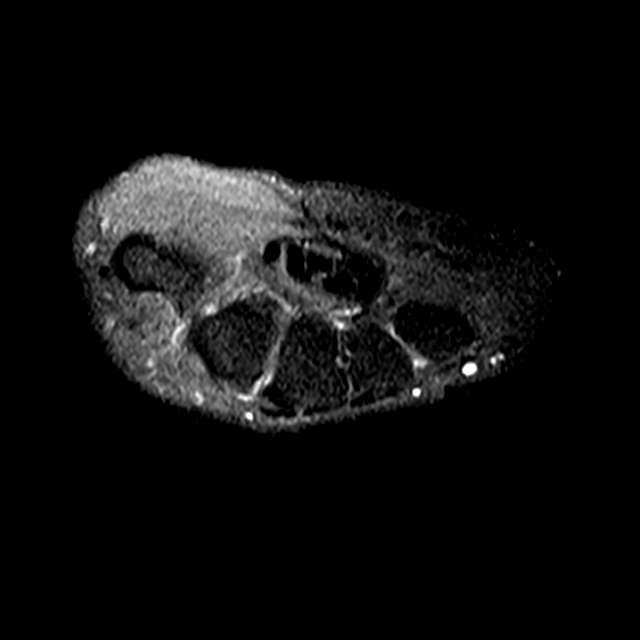
[im 24/24]
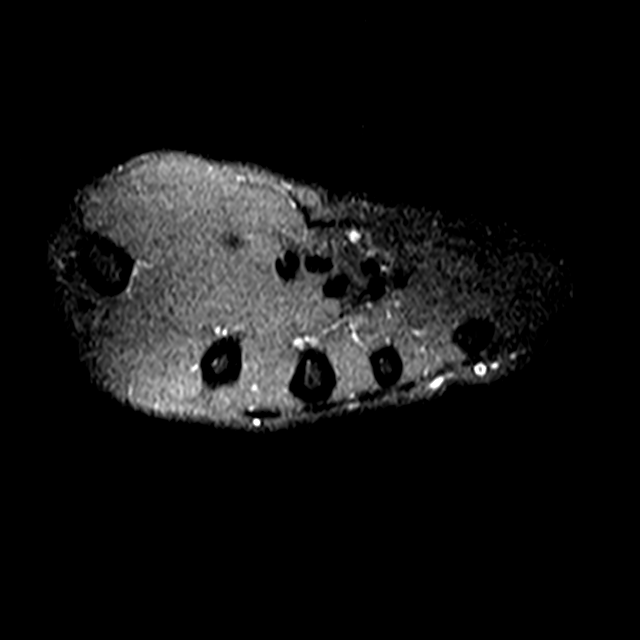

[Series 13: t2fs coronal · coronal · 3.0mm · 0.29mm/px · 5 of 18 slices shown]
[im 1/18]
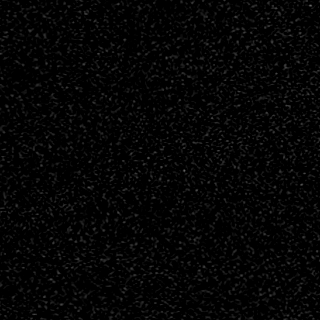
[im 5/18]
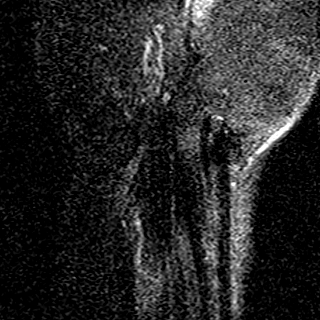
[im 9/18]
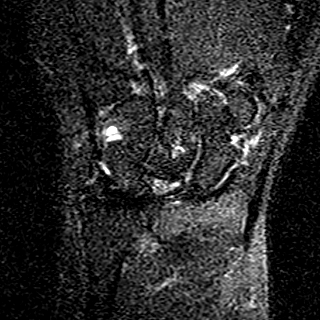
[im 13/18]
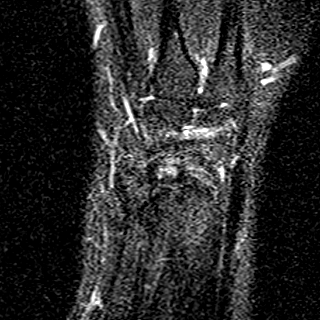
[im 18/18]
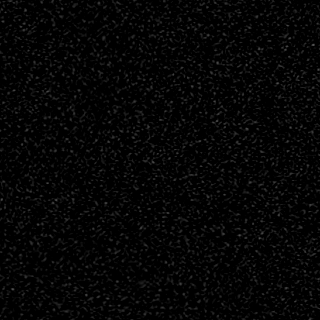

[Series 14: T1 · coronal · 3.0mm · 0.29mm/px · 5 of 18 slices shown]
[im 1/18]
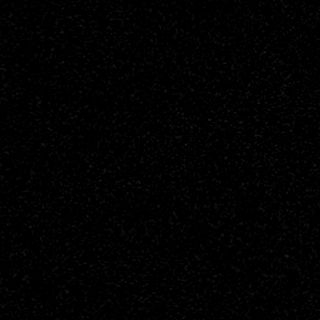
[im 5/18]
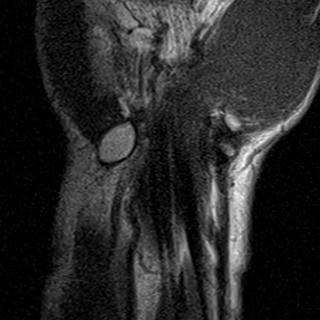
[im 9/18]
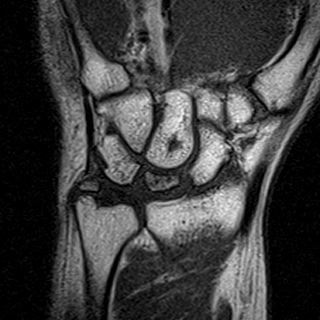
[im 13/18]
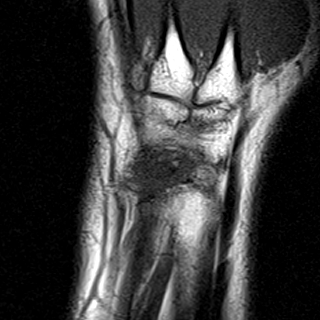
[im 18/18]
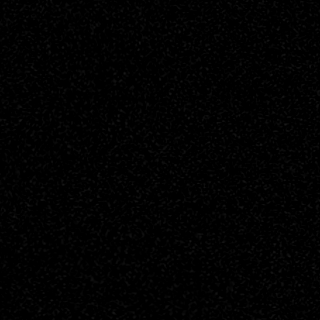

[Series 15: pdfs coronal · coronal · 3.0mm · 0.29mm/px · 5 of 18 slices shown]
[im 1/18]
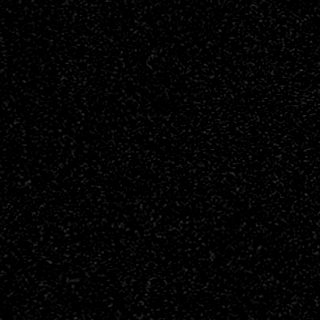
[im 5/18]
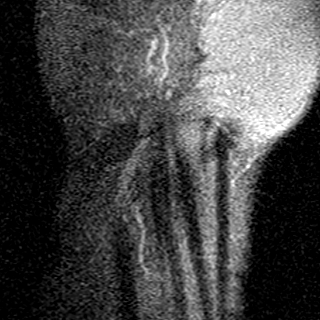
[im 9/18]
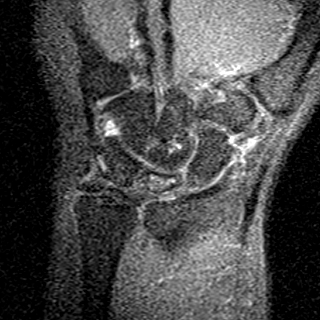
[im 13/18]
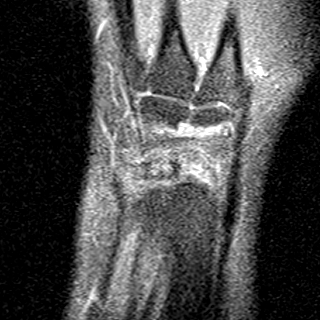
[im 18/18]
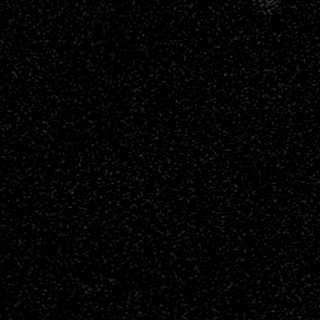

[Series 16: pdfs sagital · sagittal · 3.0mm · 0.28mm/px · 7 of 26 slices shown]
[im 1/26]
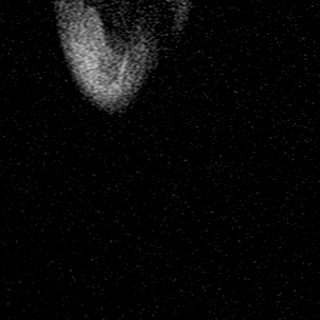
[im 5/26]
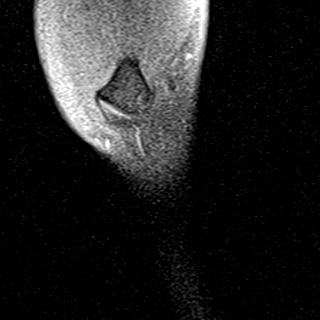
[im 9/26]
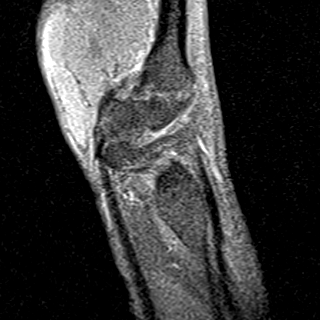
[im 13/26]
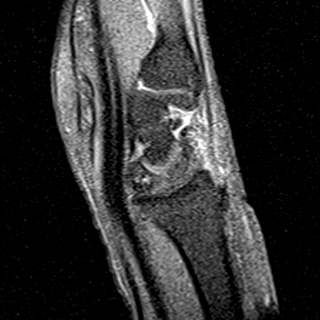
[im 17/26]
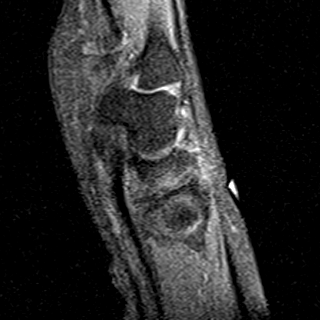
[im 21/26]
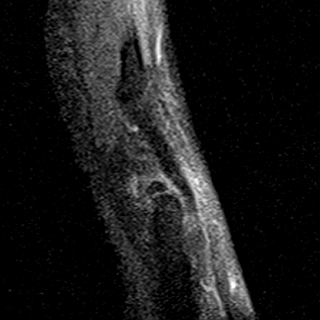
[im 26/26]
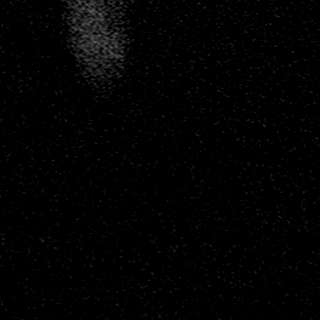

[29 of 40 positions shown; findings below may reference images not displayed]

FINDINGS: Ligaments: Intact scapholunate and lunotriquetral ligaments.

Triangular fibrocartilage: Intact.

Tendons: Intact flexor and extensor compartment tendons.

Carpal tunnel/median nerve: Normal carpal tunnel. Normal median
nerve.

Guyon's canal: Normal.

Joint/cartilage: No joint effusion. Partial-thickness cartilage loss
along the ulnar aspect of the distal radius. Mild osteoarthritis of
the scaphotrapeziotrapezoid joint and first CMC joint.

Bones/carpal alignment: Cystic changes and fragmentation of the
lunate with partial collapse and areas of low signal most consistent
with Kienbock's disease. Old ununited fracture of the ulnar styloid
process. No acute fracture or dislocation.

Other: No fluid collection or hematoma.
IMPRESSION: 1. Cystic changes and fragmentation of the lunate with partial
collapse and areas of low signal most consistent with Kienbock's
disease.

## 2017-09-27 DIAGNOSIS — E1165 Type 2 diabetes mellitus with hyperglycemia: Secondary | ICD-10-CM | POA: Diagnosis not present

## 2017-09-27 DIAGNOSIS — F329 Major depressive disorder, single episode, unspecified: Secondary | ICD-10-CM | POA: Diagnosis not present

## 2017-09-27 DIAGNOSIS — E782 Mixed hyperlipidemia: Secondary | ICD-10-CM | POA: Diagnosis not present

## 2017-09-27 DIAGNOSIS — I10 Essential (primary) hypertension: Secondary | ICD-10-CM | POA: Diagnosis not present

## 2017-09-27 DIAGNOSIS — E876 Hypokalemia: Secondary | ICD-10-CM | POA: Diagnosis not present

## 2017-09-27 DIAGNOSIS — F411 Generalized anxiety disorder: Secondary | ICD-10-CM | POA: Diagnosis not present

## 2017-10-06 DIAGNOSIS — E1165 Type 2 diabetes mellitus with hyperglycemia: Secondary | ICD-10-CM | POA: Diagnosis not present

## 2017-10-06 DIAGNOSIS — G35 Multiple sclerosis: Secondary | ICD-10-CM | POA: Diagnosis not present

## 2017-10-06 DIAGNOSIS — F411 Generalized anxiety disorder: Secondary | ICD-10-CM | POA: Diagnosis not present

## 2017-10-06 DIAGNOSIS — F329 Major depressive disorder, single episode, unspecified: Secondary | ICD-10-CM | POA: Diagnosis not present

## 2017-10-06 DIAGNOSIS — G43909 Migraine, unspecified, not intractable, without status migrainosus: Secondary | ICD-10-CM | POA: Diagnosis not present

## 2017-10-06 DIAGNOSIS — E782 Mixed hyperlipidemia: Secondary | ICD-10-CM | POA: Diagnosis not present

## 2017-10-06 DIAGNOSIS — Z0000 Encounter for general adult medical examination without abnormal findings: Secondary | ICD-10-CM | POA: Diagnosis not present

## 2017-10-06 DIAGNOSIS — I10 Essential (primary) hypertension: Secondary | ICD-10-CM | POA: Diagnosis not present

## 2017-10-06 DIAGNOSIS — E876 Hypokalemia: Secondary | ICD-10-CM | POA: Diagnosis not present

## 2017-12-05 DIAGNOSIS — Z6832 Body mass index (BMI) 32.0-32.9, adult: Secondary | ICD-10-CM | POA: Diagnosis not present

## 2017-12-05 DIAGNOSIS — R590 Localized enlarged lymph nodes: Secondary | ICD-10-CM | POA: Diagnosis not present

## 2017-12-05 DIAGNOSIS — B370 Candidal stomatitis: Secondary | ICD-10-CM | POA: Diagnosis not present

## 2017-12-23 DIAGNOSIS — J209 Acute bronchitis, unspecified: Secondary | ICD-10-CM | POA: Diagnosis not present

## 2017-12-23 DIAGNOSIS — Z6832 Body mass index (BMI) 32.0-32.9, adult: Secondary | ICD-10-CM | POA: Diagnosis not present

## 2017-12-23 DIAGNOSIS — J019 Acute sinusitis, unspecified: Secondary | ICD-10-CM | POA: Diagnosis not present

## 2017-12-23 DIAGNOSIS — J040 Acute laryngitis: Secondary | ICD-10-CM | POA: Diagnosis not present

## 2017-12-27 DIAGNOSIS — E1165 Type 2 diabetes mellitus with hyperglycemia: Secondary | ICD-10-CM | POA: Diagnosis not present

## 2017-12-27 DIAGNOSIS — I10 Essential (primary) hypertension: Secondary | ICD-10-CM | POA: Diagnosis not present

## 2017-12-27 DIAGNOSIS — E782 Mixed hyperlipidemia: Secondary | ICD-10-CM | POA: Diagnosis not present

## 2018-01-03 DIAGNOSIS — E1165 Type 2 diabetes mellitus with hyperglycemia: Secondary | ICD-10-CM | POA: Diagnosis not present

## 2018-01-03 DIAGNOSIS — G35 Multiple sclerosis: Secondary | ICD-10-CM | POA: Diagnosis not present

## 2018-01-03 DIAGNOSIS — E782 Mixed hyperlipidemia: Secondary | ICD-10-CM | POA: Diagnosis not present

## 2018-01-03 DIAGNOSIS — G43909 Migraine, unspecified, not intractable, without status migrainosus: Secondary | ICD-10-CM | POA: Diagnosis not present

## 2018-01-03 DIAGNOSIS — E876 Hypokalemia: Secondary | ICD-10-CM | POA: Diagnosis not present

## 2018-01-03 DIAGNOSIS — I10 Essential (primary) hypertension: Secondary | ICD-10-CM | POA: Diagnosis not present

## 2018-01-03 DIAGNOSIS — F411 Generalized anxiety disorder: Secondary | ICD-10-CM | POA: Diagnosis not present

## 2018-01-03 DIAGNOSIS — Z6832 Body mass index (BMI) 32.0-32.9, adult: Secondary | ICD-10-CM | POA: Diagnosis not present

## 2018-01-26 DIAGNOSIS — Z6832 Body mass index (BMI) 32.0-32.9, adult: Secondary | ICD-10-CM | POA: Diagnosis not present

## 2018-01-26 DIAGNOSIS — G35 Multiple sclerosis: Secondary | ICD-10-CM | POA: Diagnosis not present

## 2018-02-26 ENCOUNTER — Emergency Department (HOSPITAL_COMMUNITY): Payer: Medicare HMO

## 2018-02-26 ENCOUNTER — Inpatient Hospital Stay (HOSPITAL_COMMUNITY)
Admission: EM | Admit: 2018-02-26 | Discharge: 2018-03-01 | DRG: 816 | Disposition: A | Discharge status: Home / Self Care | Payer: Medicare HMO | Attending: Family Medicine | Admitting: Family Medicine

## 2018-02-26 ENCOUNTER — Encounter (HOSPITAL_COMMUNITY): Payer: Self-pay | Admitting: Emergency Medicine

## 2018-02-26 DIAGNOSIS — I776 Arteritis, unspecified: Secondary | ICD-10-CM | POA: Diagnosis not present

## 2018-02-26 DIAGNOSIS — F329 Major depressive disorder, single episode, unspecified: Secondary | ICD-10-CM | POA: Diagnosis not present

## 2018-02-26 DIAGNOSIS — I771 Stricture of artery: Secondary | ICD-10-CM

## 2018-02-26 DIAGNOSIS — R51 Headache: Secondary | ICD-10-CM | POA: Diagnosis present

## 2018-02-26 DIAGNOSIS — D649 Anemia, unspecified: Secondary | ICD-10-CM | POA: Diagnosis not present

## 2018-02-26 DIAGNOSIS — D735 Infarction of spleen: Secondary | ICD-10-CM | POA: Diagnosis not present

## 2018-02-26 DIAGNOSIS — Z9049 Acquired absence of other specified parts of digestive tract: Secondary | ICD-10-CM | POA: Diagnosis not present

## 2018-02-26 DIAGNOSIS — Z79899 Other long term (current) drug therapy: Secondary | ICD-10-CM | POA: Diagnosis not present

## 2018-02-26 DIAGNOSIS — I10 Essential (primary) hypertension: Secondary | ICD-10-CM | POA: Diagnosis present

## 2018-02-26 DIAGNOSIS — G35 Multiple sclerosis: Secondary | ICD-10-CM | POA: Diagnosis not present

## 2018-02-26 DIAGNOSIS — E876 Hypokalemia: Secondary | ICD-10-CM | POA: Diagnosis present

## 2018-02-26 DIAGNOSIS — R519 Headache, unspecified: Secondary | ICD-10-CM | POA: Diagnosis present

## 2018-02-26 DIAGNOSIS — Z885 Allergy status to narcotic agent status: Secondary | ICD-10-CM

## 2018-02-26 DIAGNOSIS — F419 Anxiety disorder, unspecified: Secondary | ICD-10-CM | POA: Diagnosis not present

## 2018-02-26 DIAGNOSIS — R109 Unspecified abdominal pain: Secondary | ICD-10-CM | POA: Diagnosis not present

## 2018-02-26 DIAGNOSIS — R1013 Epigastric pain: Secondary | ICD-10-CM | POA: Diagnosis not present

## 2018-02-26 DIAGNOSIS — D509 Iron deficiency anemia, unspecified: Secondary | ICD-10-CM | POA: Diagnosis not present

## 2018-02-26 DIAGNOSIS — I5030 Unspecified diastolic (congestive) heart failure: Secondary | ICD-10-CM | POA: Diagnosis not present

## 2018-02-26 HISTORY — DX: Essential (primary) hypertension: I10

## 2018-02-26 LAB — COMPREHENSIVE METABOLIC PANEL
ALBUMIN: 3.7 g/dL (ref 3.5–5.0)
ALK PHOS: 88 U/L (ref 38–126)
ALT: 15 U/L (ref 0–44)
ANION GAP: 7 (ref 5–15)
AST: 33 U/L (ref 15–41)
BUN: 15 mg/dL (ref 6–20)
CALCIUM: 9 mg/dL (ref 8.9–10.3)
CO2: 22 mmol/L (ref 22–32)
Chloride: 109 mmol/L (ref 98–111)
Creatinine, Ser: 1.09 mg/dL — ABNORMAL HIGH (ref 0.44–1.00)
GFR calc Af Amer: >60 mL/min (ref >60)
GFR calc non Af Amer: 60 mL/min — ABNORMAL LOW (ref >60)
GLUCOSE: 133 mg/dL — AB (ref 70–99)
POTASSIUM: 3.4 mmol/L — AB (ref 3.5–5.1)
SODIUM: 138 mmol/L (ref 135–145)
Total Bilirubin: 0.6 mg/dL (ref 0.3–1.2)
Total Protein: 7.7 g/dL (ref 6.5–8.1)

## 2018-02-26 LAB — CBC WITH DIFFERENTIAL/PLATELET
BASOS PCT: 0 %
Basophils Absolute: 0 10*3/uL (ref 0.0–0.1)
EOS ABS: 0.1 10*3/uL (ref 0.0–0.7)
EOS PCT: 1 %
HCT: 34.7 % — ABNORMAL LOW (ref 36.0–46.0)
HEMOGLOBIN: 11.4 g/dL — AB (ref 12.0–15.0)
Lymphocytes Relative: 7 %
Lymphs Abs: 0.9 10*3/uL (ref 0.7–4.0)
MCH: 26.6 pg (ref 26.0–34.0)
MCHC: 32.9 g/dL (ref 30.0–36.0)
MCV: 80.9 fL (ref 78.0–100.0)
Monocytes Absolute: 1 10*3/uL (ref 0.1–1.0)
Monocytes Relative: 8 %
NEUTROS PCT: 84 %
Neutro Abs: 10.6 10*3/uL — ABNORMAL HIGH (ref 1.7–7.7)
PLATELETS: 212 10*3/uL (ref 150–400)
RBC: 4.29 MIL/uL (ref 3.87–5.11)
RDW: 15.3 % (ref 11.5–15.5)
WBC: 12.6 10*3/uL — AB (ref 4.0–10.5)

## 2018-02-26 LAB — MAGNESIUM: MAGNESIUM: 2.1 mg/dL (ref 1.7–2.4)

## 2018-02-26 LAB — LIPASE, BLOOD: Lipase: 29 U/L (ref 11–51)

## 2018-02-26 LAB — TROPONIN I: Troponin I: <0.03 ng/mL (ref <0.03)

## 2018-02-26 LAB — PHOSPHORUS: Phosphorus: 2.6 mg/dL (ref 2.5–4.6)

## 2018-02-26 MED ORDER — LACTATED RINGERS IV BOLUS
1000.0000 mL | Freq: Once | INTRAVENOUS | Status: AC
Start: 2018-02-26 — End: 2018-02-26
  Administered 2018-02-26: 1000 mL via INTRAVENOUS

## 2018-02-26 MED ORDER — HEPARIN (PORCINE) IN NACL 100-0.45 UNIT/ML-% IJ SOLN
1750.0000 [IU]/h | INTRAMUSCULAR | Status: DC
  Administered 2018-02-26: 1250 [IU]/h via INTRAVENOUS
  Administered 2018-02-28: 1750 [IU]/h via INTRAVENOUS
  Filled 2018-02-26 (×2): qty 250

## 2018-02-26 MED ORDER — HYDROMORPHONE HCL 1 MG/ML IJ SOLN
1.0000 mg | INTRAMUSCULAR | Status: DC | PRN
  Administered 2018-02-27 – 2018-02-28 (×10): 1 mg via INTRAVENOUS
  Filled 2018-02-26 (×10): qty 1

## 2018-02-26 MED ORDER — ONDANSETRON HCL 4 MG/2ML IJ SOLN
4.0000 mg | Freq: Once | INTRAMUSCULAR | Status: AC
  Administered 2018-02-26: 4 mg via INTRAVENOUS
  Filled 2018-02-26: qty 2

## 2018-02-26 MED ORDER — IOPAMIDOL (ISOVUE-300) INJECTION 61%
100.0000 mL | Freq: Once | INTRAVENOUS | Status: AC | PRN
  Administered 2018-02-26: 100 mL via INTRAVENOUS

## 2018-02-26 MED ORDER — FAMOTIDINE IN NACL 20-0.9 MG/50ML-% IV SOLN
20.0000 mg | Freq: Two times a day (BID) | INTRAVENOUS | Status: DC
  Administered 2018-02-26 – 2018-02-28 (×5): 20 mg via INTRAVENOUS
  Filled 2018-02-26 (×5): qty 50

## 2018-02-26 MED ORDER — LACTATED RINGERS IV BOLUS
1000.0000 mL | Freq: Once | INTRAVENOUS | Status: DC
  Administered 2018-02-26: 1000 mL via INTRAVENOUS

## 2018-02-26 MED ORDER — LACTATED RINGERS IV SOLN
INTRAVENOUS | Status: DC
  Administered 2018-02-26: 22:00:00 via INTRAVENOUS

## 2018-02-26 MED ORDER — POTASSIUM CHLORIDE IN NACL 20-0.9 MEQ/L-% IV SOLN
INTRAVENOUS | Status: AC
  Administered 2018-02-26: 23:00:00 via INTRAVENOUS
  Filled 2018-02-26: qty 1000

## 2018-02-26 MED ORDER — HYDROMORPHONE HCL 1 MG/ML IJ SOLN
1.0000 mg | Freq: Once | INTRAMUSCULAR | Status: AC
  Administered 2018-02-26: 1 mg via INTRAVENOUS
  Filled 2018-02-26: qty 1

## 2018-02-26 MED ORDER — HEPARIN BOLUS VIA INFUSION
4000.0000 [IU] | Freq: Once | INTRAVENOUS | Status: AC
  Administered 2018-02-26: 4000 [IU] via INTRAVENOUS

## 2018-02-26 MED ORDER — HYDROMORPHONE HCL 1 MG/ML IJ SOLN
0.7500 mg | Freq: Once | INTRAMUSCULAR | Status: AC
  Administered 2018-02-26: 0.75 mg via INTRAVENOUS
  Filled 2018-02-26: qty 1

## 2018-02-26 MED ORDER — ONDANSETRON HCL 4 MG/2ML IJ SOLN
4.0000 mg | Freq: Four times a day (QID) | INTRAMUSCULAR | Status: DC | PRN
  Administered 2018-02-27: 4 mg via INTRAVENOUS
  Filled 2018-02-26: qty 2

## 2018-02-26 NOTE — Progress Notes (Signed)
ANTICOAGULATION CONSULT NOTE - Preliminary  Pharmacy Consult for Heparin Indication: Splenic Artery Thrombus  Allergies  Allergen Reactions  . Codeine     Patient Measurements: Height: 5\' 5"  (165.1 cm) Weight: 199 lb (90.3 kg) IBW/kg (Calculated) : 57 kg HEPARIN DW (KG): 77 kg   Vital Signs: Temp: 99.6 F (37.6 C) (07/14 1707) Temp Source: Oral (07/14 1707) BP: 110/83 (07/14 2200) Pulse Rate: 107 (07/14 1707)  Labs: Recent Labs    02/26/18 1848  HGB 11.4*  HCT 34.7*  PLT 212  CREATININE 1.09*  TROPONINI <0.03   Estimated Creatinine Clearance: 71.6 mL/min (A) (by C-G formula based on SCr of 1.09 mg/dL (H)).  Medical History: Past Medical History:  Diagnosis Date  . Anxiety and depression   . Headache(784.0)   . Hypertension   . Multiple sclerosis [facility]) March 1997    Medications:   Infusions:  . 0.9 % NaCl with KCl 20 mEq / L 500 mL/hr at 02/26/18 2308  . famotidine (PEPCID) IV Stopped (02/26/18 2248)  . 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  Vascular surgery consulted.  Goal of Therapy:  Heparin level 0.3-0.7 units/ml   Plan:  Give 4000 units bolus x 1 Start heparin infusion at 1250 units/hr Check anti-Xa level in 6 hours and daily while on heparin Continue to monitor H&H and platelets Preliminary review of pertinent patient information completed.  Forestine Na clinical pharmacist will complete review during morning rounds to assess the patient and finalize treatment regimen.  Beryle Lathe, [facility] 02/26/2018,11:12 PM

## 2018-02-26 NOTE — ED Notes (Signed)
Patient transported to CT 

## 2018-02-26 NOTE — H&P (Signed)
History and Physical    DANIJELA VESSEY KGM:[id number] DOB: March 05, 1971 DOA: 02/26/2018  PCP: [facility], [facility]   Patient coming from: Home.  I have personally briefly reviewed patient's old medical records in [place]  Chief Complaint: Abdominal pain.  HPI: Amy Horton is a 47 y.o. female with medical history significant of anxiety, depression, headache, hypertension, multiple sclerosis, cholelithiasis, history of cholecystectomy, who is coming to the emergency department with complaints of epigastric abdominal pain associated with nausea since yesterday evening that woke her up from sleep last night.  She ate 2 hotdogs and a tackle for dinner and thought that these may be causing her pain.  She took 2 tablets of Tums and waited for about 30 to 35 minutes, but the pain did not subside.  She subsequently woke up her husband and went to [facility], where she was told that labs were unremarkable.  She was given an anti-inflammatory and a GI cocktail which did not help.  She had an unknown medication that helped her pain until 930 and this morning, when she woke up again and had immediate pain after she drank some water.  She denies fever, chills, emesis, diarrhea, constipation, melena or hematochezia.  Denies dysuria, frequency or hematuria.  Denies chest pain, dyspnea, palpitations, diaphoresis, PND, orthopnea or pitting edema of the lower extremities.  No polyuria, polydipsia, polyphagia, blurred vision, heat or cold intolerance.  No skin rashes or pruritus.  ED Course: Initial vital signs temperature 99.6 F, pulse 107, respirations 18, blood pressure 153/99 mmHg and O2 sat 98% on room air.  The patient was started on an LR infusion at 125 mL/hr, given hydromorphone 1 mg IVP x1 and ondansetron 4 mg IVP x1.  She states that her pain is partially relieved.  White count was 12.6 with 84% neutrophils, 7% lymphocytes and 8% monocytes.  Hemoglobin 11.4 g/dL, which is decreased  from previous values of  around 12 to 13 g/dL.  Platelets are 212.  Sodium 138, potassium 3.4, chloride 109, CO2 22 mmol/L.  BUN was 15, creatinine 1.09, glucose 133, calcium 9.0 mg/dL.  Her hepatic function tests were within normal limits.  EKG was sinus rhythm with right axis deviation, questionable anterior infarct.  Troponin level was less than 0.03 ng/mL per.  CT abdomen and pelvis with contrast showed large volume splenic infarct, secondary to thrombus within the distal splenic artery branch.  There is abnormal appearance of the celiac and 6 and branches.  Favor vasculitis.  Especially within the common hepatic artery, and a component of dissection is also a concern.  Dedicated CTA or direct angiogram may be informative.  There was hepatic steatosis and atherogenic hepatic enhancement presumably secondary to the common hepatic artery process.  Cannot exclude mild cirrhosis.  Review of Systems: As per HPI otherwise 10 point review of systems negative.    Past Medical History:  Diagnosis Date  . Anxiety and depression   . Headache(784.0)   . Hypertension   . Multiple sclerosis [facility]) March 1997    Past Surgical History:  Procedure Laterality Date  . bone removed from left wrist     . BREAST BIOPSY     teenager  . CESAREAN SECTION  [id number]   x2  . CHOLECYSTECTOMY  2002  . stent in bile duct     . TYMPANOSTOMY TUBE PLACEMENT     x2 as a child     reports that she has never smoked. She has never used smokeless tobacco.  She reports that she does not drink alcohol or use drugs.  Allergies  Allergen Reactions  . Codeine     Family History  Problem Relation Age of Onset  . Hypertension Other     Prior to Admission medications   Medication Sig Start Date End Date Taking? Authorizing Provider  ALPRAZolam Duanne Moron) 0.5 MG tablet Take 30 minutes prior to procedure 10/14/15  Yes Carole Civil, MD  buPROPion (WELLBUTRIN XL) 150 MG 24 hr tablet Take 1 tablet by mouth daily.  02/09/18  Yes [provider]  losartan (COZAAR) 50 MG tablet Take 100 mg by mouth daily.    Yes [provider]  sertraline (ZOLOFT) 50 MG tablet Take 50 mg by mouth 3 (three) times daily. 02/17/18  Yes [provider]  topiramate (TOPAMAX) 25 MG tablet Take 2 tablets by mouth 2 (two) times daily. 02/09/18  Yes [provider]  acyclovir (ZOVIRAX) 400 MG tablet Take 1 tablet by mouth as needed.    [provider]    Physical Exam: Vitals:   02/26/18 2115 02/26/18 2130 02/26/18 2145 02/26/18 2200  BP:    110/83  Pulse:      Resp: 20 13 15 17   Temp:      TempSrc:      SpO2:      Weight:      Height:        Constitutional: NAD, calm, comfortable Eyes: PERRL, lids and conjunctivae normal ENMT: Mucous membranes are moist. Posterior pharynx clear of any exudate or lesions. Neck: normal, supple, no masses, no thyromegaly Respiratory: clear to auscultation bilaterally, no wheezing, no crackles. Normal respiratory effort. No accessory muscle use.  Cardiovascular: Regular rate and rhythm, no murmurs / rubs / gallops. No extremity edema. 2+ pedal pulses. No carotid bruits.  Abdomen: Nondistended.   Bowel sounds positive.  Positive epigastric tenderness, no guarding/rebound/masses palpated. No hepatosplenomegaly. Musculoskeletal: no clubbing / cyanosis. No gross joint deformity upper and lower extremities. Good ROM, no contractures. Normal muscle tone.  Skin: no rashes, lesions, ulcers on limited dermatological examination. Neurologic: CN 2-12 grossly intact. Sensation intact, DTR normal. Strength 5/5 in all 4.  Psychiatric: Normal judgment and insight. Alert and oriented x 3. Normal mood.    Labs on Admission: I have personally reviewed following labs and imaging studies  CBC: Recent Labs  Lab 02/26/18 1848  WBC 12.6*  NEUTROABS 10.6*  HGB 11.4*  HCT 34.7*  MCV 80.9  PLT 867   Basic Metabolic Panel: Recent Labs  Lab 02/26/18 1848  NA  138  K 3.4*  CL 109  CO2 22  GLUCOSE 133*  BUN 15  CREATININE 1.09*  CALCIUM 9.0   GFR: Estimated Creatinine Clearance: 71.6 mL/min (A) (by C-G formula based on SCr of 1.09 mg/dL (H)). Liver Function Tests: Recent Labs  Lab 02/26/18 1848  AST 33  ALT 15  ALKPHOS 88  BILITOT 0.6  PROT 7.7  ALBUMIN 3.7   Recent Labs  Lab 02/26/18 1848  LIPASE 29   No results for input(s): AMMONIA in the last 168 hours. Coagulation Profile: No results for input(s): INR, PROTIME in the last 168 hours. Cardiac Enzymes: Recent Labs  Lab 02/26/18 1848  TROPONINI <0.03   BNP (last 3 results) No results for input(s): PROBNP in the last 8760 hours. HbA1C: No results for input(s): HGBA1C in the last 72 hours. CBG: No results for input(s): GLUCAP in the last 168 hours. Lipid Profile: No results for input(s): CHOL, HDL, LDLCALC,  TRIG, CHOLHDL, LDLDIRECT in the last 72 hours. Thyroid Function Tests: No results for input(s): TSH, T4TOTAL, FREET4, T3FREE, THYROIDAB in the last 72 hours. Anemia Panel: No results for input(s): VITAMINB12, FOLATE, FERRITIN, TIBC, IRON, RETICCTPCT in the last 72 hours. Urine analysis: No results found for: COLORURINE, APPEARANCEUR, [place], [place], GLUCOSEU, HGBUR, BILIRUBINUR, KETONESUR, PROTEINUR, UROBILINOGEN, NITRITE, LEUKOCYTESUR  Radiological Exams on Admission: Ct Abdomen Pelvis W Contrast  Result Date: 02/26/2018 CLINICAL DATA:  Epigastric abdominal pain.  Prior cholecystectomy. EXAM: CT ABDOMEN AND PELVIS WITH CONTRAST TECHNIQUE: Multidetector CT imaging of the abdomen and pelvis was performed using the standard protocol following bolus administration of intravenous contrast. CONTRAST:  171mL ISOVUE-300 IOPAMIDOL (ISOVUE-300) INJECTION 61% COMPARISON:  Plain films of earlier today.  CT 10/08/2016 FINDINGS: Lower chest: Atelectasis or scar at the left lung base. Normal heart size without pericardial or pleural effusion. Hepatobiliary: Heterogeneous  enhancement of the liver. Underlying hepatic steatosis. Mildly irregular hepatic capsule. Cholecystectomy, without biliary ductal dilatation. Pancreas: Normal, without mass or ductal dilatation. Spleen: Infarction of greater than 1/2 of the spleen, primarily anteriorly and inferiorly. Adrenals/Urinary Tract: Normal adrenal glands. Normal kidneys, without hydronephrosis. Normal urinary bladder. Stomach/Bowel: Normal stomach, without wall thickening. Normal colon, appendix, and terminal ileum. Normal small bowel. Vascular/Lymphatic: No evidence of aortic dissection. There is soft tissue thickening about the left side of the celiac axis on 26/2. Significant stenosis throughout the common hepatic artery on 26/2 with noncalcified soft tissue density within. soft tissue thickening along or within the posterior aspect of the splenic artery on 21/2. Perivascular edema in this region. A branch of the distal splenic artery supplying the anterior and inferior spleen is nonenhancing and presumably occluded on 22/2 and coronal image 49 the SMA and IMA are both patent. Patent portal and splenic veins. Mild calcified aortic atherosclerosis. No abdominopelvic adenopathy. Reproductive: Normal uterus and adnexa.  Tampon in place. Other: No significant free fluid. Musculoskeletal: No acute osseous abnormality. IMPRESSION: 1. 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 2. Abnormal appearance of the celiac axis and branches. Favor vasculitis. Especially within the common hepatic artery, a component of dissection is also a concern. Dedicated CTA or direct angiogram may be informative. 3. Hepatic steatosis and heterogeneous hepatic enhancement, presumably secondary to the common hepatic artery process. Cannot exclude mild cirrhosis. Correlate with risk factors. These results were called by telephone at the time of interpretation on 02/26/2018 at 9:00 pm to Dr. Merrily Pew , who verbally  acknowledged these results. Electronically Signed   By: Abigail Miyamoto M.D.   On: 02/26/2018 21:02    EKG: Independently reviewed.  Vent. 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 Admit to [facility]/inpatient. Keep n.p.o. Continue IV fluids. Analgesics as needed. Antiemetics as needed. Famotidine 20 mg IVP every 12 hours. Vascular surgery to evaluate. Will most likely need CTA of intra-abdominal arteries. Check ANA, RF and total complement.  Active Problems:   Multiple sclerosis (HCC) No signs of exacerbation at this time.    Anxiety and depression N.p.o. at the moment. Resume sertraline and swallow of once cleared for oral intake by vascular surgery.    Headache(784.0) Resume Topamax once cleared for oral intake.    Hypertension Hold losartan for now. Monitor blood pressure.  Normocytic anemia Monitor hematocrit and hemoglobin. Check anemia panel.    Hypokalemia Replacing. Check magnesium and phosphorus level. Follow-up potassium level.   DVT prophylaxis: Heparin IV versus Lovenox SQ. Code Status: Full code Family Communication: Her husband was present in the ED. Disposition Plan: Transfer to [facility] for vascular surgery evaluation. Consults called: Vascular surgery on call was contacted by Dr. Dayna Barker. Admission status: Inpatient/telemetry.   Reubin Milan MD [facility] Pager [phone].  If 7PM-7AM, please contact night-coverage [email or web address] Password [facility]  02/26/2018, 10:45 PM   This document was prepared using Dragon voice recognition software and may contain some unintended transcription errors.

## 2018-02-26 NOTE — ED Provider Notes (Signed)
Emergency Department Provider Note   I have reviewed the triage vital signs and the nursing notes.   HISTORY  Chief Complaint Abdominal Pain   HPI Amy Horton is a 47 y.o. female with history of multiple sclerosis, depression and cholelithiasis status post cholecystectomy and some type of bile duct stent the presents emergency room today with epigastric pain.  Patient states that started last night if she had a couple hotdogs and a taco.  She went to sleep she woke up with severe epigastric pain that did not radiate.  She has some nausea but no vomiting.  She tried taking antacids which did not seem to help with her symptoms so she went to [facility] rocking him where labs were done that were apparently unremarkable.  I also gave her a GI cocktail which did not help.  They also gave her anti-inflammatories did not help.  He gave her third medication which she says helped her pain until 930 this morning when she woke up and had a couple water which caused instant pain.  She tried little water afterwards and the pain got worse.  She had some nausea again but no vomiting.  No diarrhea or constipation.  States this pain feels very similar to pain that she had when she had her gallbladder removed. No other associated or modifying symptoms.    Past Medical History:  Diagnosis Date  . Anxiety and depression   . Headache(784.0)   . Multiple sclerosis [facility]) March 1997    Patient Active Problem List   Diagnosis Date Noted  . Multiple sclerosis ([place]) 11/21/2012    Past Surgical History:  Procedure Laterality Date  . bone removed from left wrist     . BREAST BIOPSY     teenager  . CESAREAN SECTION  [id number]   x2  . CHOLECYSTECTOMY  2002  . stent in bile duct     . TYMPANOSTOMY TUBE PLACEMENT     x2 as a child    Current Outpatient Rx  . Order #: [id number] Class: Print  . Order #: [id number] Class: Historical Med  . Order #: [id number] Class: Historical Med  . Order #: [id number] Class:  Historical Med  . Order #: [id number] Class: Historical Med  . Order #: [id number] Class: Historical Med    Allergies Codeine  No family history on file.  Social History Social History   Tobacco Use  . Smoking status: Never Smoker  . Smokeless tobacco: Never Used  Substance Use Topics  . Alcohol use: No  . Drug use: No    Review of Systems  All other systems negative except as documented in the HPI. All pertinent positives and negatives as reviewed in the HPI. ____________________________________________   PHYSICAL EXAM:  VITAL SIGNS: ED Triage Vitals  Enc Vitals Group     BP 02/26/18 1707 (!) 153/99     Pulse Rate 02/26/18 1707 (!) 107     Resp 02/26/18 1707 18     Temp 02/26/18 1707 99.6 F (37.6 C)     Temp Source 02/26/18 1707 Oral     SpO2 02/26/18 1707 98 %     Weight 02/26/18 1707 199 lb (90.3 kg)     Height 02/26/18 1707 5\' 5"  (1.651 m)    Constitutional: Alert and oriented. Well appearing and in no acute distress. Eyes: Conjunctivae are normal. PERRL. EOMI. Head: Atraumatic. Nose: No congestion/rhinnorhea. Mouth/Throat: Mucous membranes are moist.  Oropharynx non-erythematous. Neck: No stridor.  No meningeal signs.   Cardiovascular: tachycardicrate, regular rhythm.  Good peripheral circulation. Grossly normal heart sounds.   Respiratory: Normal respiratory effort.  No retractions. Lungs CTAB. Gastrointestinal: Soft and ttp in epigastric pain. No distention.  Musculoskeletal: No lower extremity tenderness nor edema. No gross deformities of extremities. Neurologic:  Normal speech and language. No gross focal neurologic deficits are appreciated.  Skin:  Skin is warm, dry and intact. No rash noted.  ____________________________________________   LABS (all labs ordered are listed, but only abnormal results are displayed)  Labs Reviewed  CBC WITH DIFFERENTIAL/PLATELET  COMPREHENSIVE METABOLIC PANEL  LIPASE, BLOOD  TROPONIN I    ____________________________________________  EKG   EKG Interpretation  Date/Time:  Sunday February 26 2018 18:17:36 EDT Ventricular Rate:  92 PR Interval:    QRS Duration: 99 QT Interval:  375 QTC Calculation: 464 R Axis:   102 Text Interpretation:  Sinus rhythm Right axis deviation Low voltage, extremity and precordial leads Consider anterior infarct No old tracing to compare Confirmed by Merrily Pew [phone]) on 02/26/2018 7:03:06 PM       ____________________________________________  RADIOLOGY  No results found.  ____________________________________________   PROCEDURES  Procedure(s) performed:   Procedures   ____________________________________________   INITIAL IMPRESSION / ASSESSMENT AND PLAN / ED COURSE  Eval with CT scan, labs EKG and further work-up.  Pain medication, fluids and nausea meds.  Found to have splenic infarct and other arterial narrowngs thought to be related to vasculitis.  I discussed with vascular surgery who felt that the patient would need admission to [facility] with vascular surgery consultation and requested that I would not start any medications.  When the full radiology report came out it did say that she had a thrombosis in her left splenic artery so she was started on heparin as I cannot reach vascular surgeon.  Discussed with medicine who will admit for same.     Pertinent labs & imaging results that were available during my care of the patient were reviewed by me and considered in my medical decision making (see chart for details).  ____________________________________________  FINAL CLINICAL IMPRESSION(S) / ED DIAGNOSES  Final diagnoses:  Splenic infarct  Diffuse narrowing of hepatic artery (HCC)     MEDICATIONS GIVEN DURING THIS VISIT:  Medications  lactated ringers bolus 1,000 mL (1,000 mLs Intravenous New Bag/Given 02/26/18 1844)  HYDROmorphone (DILAUDID) injection 1 mg (1 mg Intravenous Given 02/26/18 1844)   ondansetron (ZOFRAN) injection 4 mg (4 mg Intravenous Given 02/26/18 1844)     NEW OUTPATIENT MEDICATIONS STARTED DURING THIS VISIT:  New Prescriptions   No medications on file    Note:  This note was prepared with assistance of Dragon voice recognition software. Occasional wrong-word or sound-a-like substitutions may have occurred due to the inherent limitations of voice recognition software.   Merrily Pew, MD 02/26/18 2350

## 2018-02-26 NOTE — ED Triage Notes (Signed)
Pt states that she has epigastric pain pt was seen at [facility] last night for it and the tests were negative

## 2018-02-26 NOTE — ED Notes (Signed)
APTT and Protime-INR needing to be redrawn  Previous samples hemolyzed.

## 2018-02-27 ENCOUNTER — Other Ambulatory Visit: Payer: Self-pay

## 2018-02-27 ENCOUNTER — Inpatient Hospital Stay (HOSPITAL_COMMUNITY): Payer: Medicare HMO

## 2018-02-27 DIAGNOSIS — I5030 Unspecified diastolic (congestive) heart failure: Secondary | ICD-10-CM

## 2018-02-27 DIAGNOSIS — F329 Major depressive disorder, single episode, unspecified: Secondary | ICD-10-CM

## 2018-02-27 DIAGNOSIS — D735 Infarction of spleen: Secondary | ICD-10-CM

## 2018-02-27 DIAGNOSIS — D649 Anemia, unspecified: Secondary | ICD-10-CM

## 2018-02-27 DIAGNOSIS — G35 Multiple sclerosis: Secondary | ICD-10-CM

## 2018-02-27 DIAGNOSIS — F419 Anxiety disorder, unspecified: Secondary | ICD-10-CM

## 2018-02-27 DIAGNOSIS — I771 Stricture of artery: Secondary | ICD-10-CM

## 2018-02-27 LAB — CBC WITH DIFFERENTIAL/PLATELET
ABS IMMATURE GRANULOCYTES: 0 10*3/uL (ref 0.0–0.1)
Basophils Absolute: 0 10*3/uL (ref 0.0–0.1)
Basophils Relative: 1 %
EOS ABS: 0.1 10*3/uL (ref 0.0–0.7)
Eosinophils Relative: 2 %
HCT: 33.6 % — ABNORMAL LOW (ref 36.0–46.0)
Hemoglobin: 10.4 g/dL — ABNORMAL LOW (ref 12.0–15.0)
IMMATURE GRANULOCYTES: 0 %
LYMPHS ABS: 1.4 10*3/uL (ref 0.7–4.0)
LYMPHS PCT: 18 %
MCH: 26.1 pg (ref 26.0–34.0)
MCHC: 31 g/dL (ref 30.0–36.0)
MCV: 84.2 fL (ref 78.0–100.0)
Monocytes Absolute: 0.6 10*3/uL (ref 0.1–1.0)
Monocytes Relative: 8 %
NEUTROS ABS: 5.7 10*3/uL (ref 1.7–7.7)
NEUTROS PCT: 71 %
Platelets: 187 10*3/uL (ref 150–400)
RBC: 3.99 MIL/uL (ref 3.87–5.11)
RDW: 15.4 % (ref 11.5–15.5)
WBC: 7.9 10*3/uL (ref 4.0–10.5)

## 2018-02-27 LAB — PROTIME-INR
INR: 1.23
Prothrombin Time: 15.4 seconds — ABNORMAL HIGH (ref 11.4–15.2)

## 2018-02-27 LAB — COMPREHENSIVE METABOLIC PANEL
ALBUMIN: 3.2 g/dL — AB (ref 3.5–5.0)
ALT: 14 U/L (ref 0–44)
AST: 24 U/L (ref 15–41)
Alkaline Phosphatase: 87 U/L (ref 38–126)
Anion gap: 8 (ref 5–15)
BILIRUBIN TOTAL: 0.6 mg/dL (ref 0.3–1.2)
BUN: 10 mg/dL (ref 6–20)
CHLORIDE: 107 mmol/L (ref 98–111)
CO2: 25 mmol/L (ref 22–32)
Calcium: 8.4 mg/dL — ABNORMAL LOW (ref 8.9–10.3)
Creatinine, Ser: 1.09 mg/dL — ABNORMAL HIGH (ref 0.44–1.00)
GFR calc Af Amer: >60 mL/min (ref >60)
GFR calc non Af Amer: 60 mL/min — ABNORMAL LOW (ref >60)
GLUCOSE: 119 mg/dL — AB (ref 70–99)
POTASSIUM: 3.3 mmol/L — AB (ref 3.5–5.1)
Sodium: 140 mmol/L (ref 135–145)
TOTAL PROTEIN: 7 g/dL (ref 6.5–8.1)

## 2018-02-27 LAB — ECHOCARDIOGRAM COMPLETE
HEIGHTINCHES: 65 in
Weight: 3174.4 oz

## 2018-02-27 LAB — IRON AND TIBC
Iron: 14 ug/dL — ABNORMAL LOW (ref 28–170)
Saturation Ratios: 4 % — ABNORMAL LOW (ref 10.4–31.8)
TIBC: 382 ug/dL (ref 250–450)
UIBC: 368 ug/dL

## 2018-02-27 LAB — VITAMIN B12: Vitamin B-12: 267 pg/mL (ref 180–914)

## 2018-02-27 LAB — HEPARIN LEVEL (UNFRACTIONATED)
Heparin Unfractionated: <0.1 IU/mL — ABNORMAL LOW (ref 0.30–0.70)
Heparin Unfractionated: 0.48 IU/mL (ref 0.30–0.70)

## 2018-02-27 LAB — RETICULOCYTES
RBC.: 4.02 MIL/uL (ref 3.87–5.11)
RETIC COUNT ABSOLUTE: 44.2 10*3/uL (ref 19.0–186.0)
RETIC CT PCT: 1.1 % (ref 0.4–3.1)

## 2018-02-27 LAB — FERRITIN: FERRITIN: 44 ng/mL (ref 11–307)

## 2018-02-27 LAB — SEDIMENTATION RATE: Sed Rate: 28 mm/hr — ABNORMAL HIGH (ref 0–22)

## 2018-02-27 LAB — FOLATE: Folate: 14.2 ng/mL (ref >5.9)

## 2018-02-27 LAB — C-REACTIVE PROTEIN: CRP: 15.2 mg/dL — ABNORMAL HIGH (ref <1.0)

## 2018-02-27 LAB — APTT: APTT: 34 s (ref 24–36)

## 2018-02-27 LAB — HIV ANTIBODY (ROUTINE TESTING W REFLEX): HIV Screen 4th Generation wRfx: NONREACTIVE

## 2018-02-27 MED ORDER — SERTRALINE HCL 50 MG PO TABS
50.0000 mg | ORAL_TABLET | Freq: Three times a day (TID) | ORAL | Status: DC
  Administered 2018-02-27 – 2018-03-01 (×6): 50 mg via ORAL
  Filled 2018-02-27 (×6): qty 1

## 2018-02-27 MED ORDER — HEPARIN BOLUS VIA INFUSION
3000.0000 [IU] | Freq: Once | INTRAVENOUS | Status: AC
  Administered 2018-02-27: 3000 [IU] via INTRAVENOUS
  Filled 2018-02-27: qty 3000

## 2018-02-27 MED ORDER — PREDNISONE 20 MG PO TABS
30.0000 mg | ORAL_TABLET | Freq: Two times a day (BID) | ORAL | Status: DC
  Administered 2018-02-27 – 2018-03-01 (×4): 30 mg via ORAL
  Filled 2018-02-27 (×4): qty 1

## 2018-02-27 MED ORDER — TOPIRAMATE 25 MG PO TABS
50.0000 mg | ORAL_TABLET | Freq: Two times a day (BID) | ORAL | Status: DC
  Administered 2018-02-27 – 2018-03-01 (×4): 50 mg via ORAL
  Filled 2018-02-27 (×4): qty 2

## 2018-02-27 MED ORDER — POTASSIUM CHLORIDE CRYS ER 20 MEQ PO TBCR
20.0000 meq | EXTENDED_RELEASE_TABLET | Freq: Once | ORAL | Status: AC
  Administered 2018-02-27: 20 meq via ORAL
  Filled 2018-02-27: qty 1

## 2018-02-27 MED ORDER — ACETAMINOPHEN 650 MG RE SUPP: 650.0000 mg | Freq: Four times a day (QID) | RECTAL | Status: DC | PRN

## 2018-02-27 MED ORDER — PREDNISONE 50 MG PO TABS: 60.0000 mg | ORAL_TABLET | Freq: Every day | ORAL | Status: DC

## 2018-02-27 MED ORDER — ACETAMINOPHEN 325 MG PO TABS: 650.0000 mg | ORAL_TABLET | Freq: Four times a day (QID) | ORAL | Status: DC | PRN

## 2018-02-27 MED ORDER — BUPROPION HCL ER (XL) 150 MG PO TB24
150.0000 mg | ORAL_TABLET | Freq: Every day | ORAL | Status: DC
  Administered 2018-02-27 – 2018-03-01 (×3): 150 mg via ORAL
  Filled 2018-02-27 (×3): qty 1

## 2018-02-27 NOTE — Progress Notes (Signed)
Paged MD per pt request for clarification of diet progression

## 2018-02-27 NOTE — Plan of Care (Signed)
  Problem: Health Behavior/Discharge Planning: Goal: Ability to manage health-related needs will improve Outcome: Progressing   Problem: Nutrition: Goal: Adequate nutrition will be maintained Outcome: Not Progressing   Problem: Pain Managment: Goal: General experience of comfort will improve Outcome: Not Progressing

## 2018-02-27 NOTE — Progress Notes (Addendum)
ANTICOAGULATION CONSULT NOTE - Follow Up Consult  Pharmacy Consult for Heparin Indication: splenic artery thrombus  Allergies  Allergen Reactions  . Codeine     Patient Measurements: Height: 5\' 5"  (165.1 cm) Weight: 198 lb 6.4 oz (90 kg) IBW/kg (Calculated) : 57 Heparin Dosing Weight: 77 kg  Vital Signs: Temp: 99.8 F (37.7 C) (07/15 0820) Temp Source: Oral (07/15 0820) BP: 138/92 (07/15 0820) Pulse Rate: 72 (07/15 0820)  Labs: Recent Labs    02/26/18 1848 02/26/18 2343 02/27/18 0608  HGB 11.4*  --  10.4*  HCT 34.7*  --  33.6*  PLT 212  --  187  APTT  --  34  --   LABPROT  --  15.4*  --   INR  --  1.23  --   HEPARINUNFRC  --   --  <0.10*  CREATININE 1.09*  --  1.09*  TROPONINI <0.03  --   --     Estimated Creatinine Clearance: 71.5 mL/min (A) (by C-G formula based on SCr of 1.09 mg/dL (H)).  Assessment:  47 yr old female on IV heparin for splenic artery thrombosis.  Started on IV heparin at [facility] and transferred to [facility].    Initial heparin level is undetectable (<0.1) on 1250 units/hr.  No known infusion problems.  Goal of Therapy:  Heparin level 0.3-0.7 units/ml Monitor platelets by anticoagulation protocol: Yes   Plan:   Heparin 3000 units IV bolus  Increase heparin drip to 1500 units/hr  Heparin level ~6 hrs after increase  Daily heparin level and CBC.  Arty Baumgartner, [place] Pager: [phone] or phone: [phone] 02/27/2018,8:49 AM   Addendum:   Heparin level this afternoon is still undetectable (<0.1) on 1500 units/hr.   Will re-bolus with 3000 units IV and increase to 1750 units/hr.   Heparin level in ~6 hrs.  Consuello Masse, RPh 02/27/2018 3:39 PM

## 2018-02-27 NOTE — Progress Notes (Signed)
[facility]  PROGRESS NOTE  Amy Horton DYJ:[id number] DOB: September 01, 1970 DOA: 02/26/2018 PCP: [facility]   Brief HPI:   47 year old female with a history of multiple sclerosis, cholelithiasis, status post cholecystectomy, hypertension, depression came to hospital with epigastric pain which woke her up from sleep last night.  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  Vascular surgery was consulted, 2D echo has been ordered.    Subjective   Patient seen and examined, complains of abdominal pain.   Assessment/Plan:     1. 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  Vascular surgery is following.  Also CRP and sed rate are elevated consistent with vasculitis. 2. ?  Vasculitis-CT abdomen pelvis showed abnormality in the celiac axis consistent with vasculitis, sed rate and CRP are elevated.  I called and discussed with rheumatology Dr. Trudie Reed, who recommended prednisone 30 mg p.o. twice daily, also check ANA, ANCA panel, hepatitis panel.  Patient can follow-up with Dr. Trudie Reed as outpatient. 3. Multiple sclerosis-stable, no exacerbation at this time. 4. Anxiety/ depression-continue sertraline 5. Headache-continue Topamax 6. Hypokalemia-potassium is 3.3, will replace potassium and check BMP in a.m. 7. Normocytic anemia-hemoglobin is 10.2, anemia panel has been ordered.      DVT prophylaxis: Heparin IV  Code Status: Full code  Family Communication: discussed with patient's husband at bedside  Disposition Plan: likely home when medically ready for discharge   Consultants: Vascular surgery   Procedures:  None   Antibiotics:   Anti-infectives (From admission, onward)   None       Objective   Vitals:   02/27/18 0820 02/27/18 0903 02/27/18 1200 02/27/18 1243  BP: (!) 138/92 (!) 126/93 106/73  (!) 131/93  Pulse: 72 72 72 75  Resp: 18   18  Temp: 99.8 F (37.7 C)   97.8 F (36.6 C)  TempSrc: Oral   Oral  SpO2: 99%   96%  Weight:      Height:        Intake/Output Summary (Last 24 hours) at 02/27/2018 1321 Last data filed at 02/27/2018 0932 Gross per 24 hour  Intake 1711.83 ml  Output 300 ml  Net 1411.83 ml   Filed Weights   02/26/18 1707 02/27/18 0128  Weight: 90.3 kg (199 lb) 90 kg (198 lb 6.4 oz)     Physical Examination:    General:  *Appears in no acute distress  Cardiovascular: S1-S2, regular  Respiratory: Clear to auscultation bilaterally  Abdomen: Soft,  no organomegaly,  positive epigastric tenderness to palpation  Extremities:  no edema of the lower extremities  Neurologic:  alert, oriented x3 infarction of spleen     Data Reviewed: I have personally reviewed following labs and imaging studies  CBG: No results for input(s): GLUCAP in the last 168 hours.  CBC: Recent Labs  Lab 02/26/18 1848 02/27/18 0608  WBC 12.6* 7.9  NEUTROABS 10.6* 5.7  HGB 11.4* 10.4*  HCT 34.7* 33.6*  MCV 80.9 84.2  PLT 212 403    Basic Metabolic Panel: Recent Labs  Lab 02/26/18 1848 02/27/18 0608  NA 138 140  K 3.4* 3.3*  CL 109 107  CO2 22 25  GLUCOSE 133* 119*  BUN 15 10  CREATININE 1.09* 1.09*  CALCIUM 9.0 8.4*  MG 2.1  --   PHOS 2.6  --     No results found for  this or any previous visit (from the past 240 hour(s)).   Liver Function Tests: Recent Labs  Lab 02/26/18 1848 02/27/18 0608  AST 33 24  ALT 15 14  ALKPHOS 88 87  BILITOT 0.6 0.6  PROT 7.7 7.0  ALBUMIN 3.7 3.2*   Recent Labs  Lab 02/26/18 1848  LIPASE 29   No results for input(s): AMMONIA in the last 168 hours.  Cardiac Enzymes: Recent Labs  Lab 02/26/18 1848  TROPONINI <0.03   BNP (last 3 results) No results for input(s): BNP in the last 8760 hours.  ProBNP (last 3 results) No results for input(s): PROBNP in the last 8760 hours.    Studies: Ct Abdomen  Pelvis W Contrast  Result Date: 02/26/2018 CLINICAL DATA:  Epigastric abdominal pain.  Prior cholecystectomy. EXAM: CT ABDOMEN AND PELVIS WITH CONTRAST TECHNIQUE: Multidetector CT imaging of the abdomen and pelvis was performed using the standard protocol following bolus administration of intravenous contrast. CONTRAST:  160mL ISOVUE-300 IOPAMIDOL (ISOVUE-300) INJECTION 61% COMPARISON:  Plain films of earlier today.  CT 10/08/2016 FINDINGS: Lower chest: Atelectasis or scar at the left lung base. Normal heart size without pericardial or pleural effusion. Hepatobiliary: Heterogeneous enhancement of the liver. Underlying hepatic steatosis. Mildly irregular hepatic capsule. Cholecystectomy, without biliary ductal dilatation. Pancreas: Normal, without mass or ductal dilatation. Spleen: Infarction of greater than 1/2 of the spleen, primarily anteriorly and inferiorly. Adrenals/Urinary Tract: Normal adrenal glands. Normal kidneys, without hydronephrosis. Normal urinary bladder. Stomach/Bowel: Normal stomach, without wall thickening. Normal colon, appendix, and terminal ileum. Normal small bowel. Vascular/Lymphatic: No evidence of aortic dissection. There is soft tissue thickening about the left side of the celiac axis on 26/2. Significant stenosis throughout the common hepatic artery on 26/2 with noncalcified soft tissue density within. soft tissue thickening along or within the posterior aspect of the splenic artery on 21/2. Perivascular edema in this region. A branch of the distal splenic artery supplying the anterior and inferior spleen is nonenhancing and presumably occluded on 22/2 and coronal image 49 the SMA and IMA are both patent. Patent portal and splenic veins. Mild calcified aortic atherosclerosis. No abdominopelvic adenopathy. Reproductive: Normal uterus and adnexa.  Tampon in place. Other: No significant free fluid. Musculoskeletal: No acute osseous abnormality. IMPRESSION: 1. Large volume splenic infarct,  secondary to thrombus within a distal splenic artery branch. 2. Abnormal appearance of the celiac axis and branches. Favor vasculitis. Especially within the common hepatic artery, a component of dissection is also a concern. Dedicated CTA or direct angiogram may be informative. 3. Hepatic steatosis and heterogeneous hepatic enhancement, presumably secondary to the common hepatic artery process. Cannot exclude mild cirrhosis. Correlate with risk factors. These results were called by telephone at the time of interpretation on 02/26/2018 at 9:00 pm to Dr. Merrily Pew , who verbally acknowledged these results. Electronically Signed   By: Abigail Miyamoto M.D.   On: 02/26/2018 21:02    Scheduled Meds: . predniSONE  30 mg Oral BID WC      Time spent: 25 min  [facility] Pager [phone]. If 7PM-7AM, please contact night-coverage at [email or web address], Office  [phone]  password [email or web address]  02/27/2018, 1:21 PM  LOS: 1 day

## 2018-02-27 NOTE — Progress Notes (Signed)
  Echocardiogram 2D Echocardiogram has been performed.  Deniss Wormley G Allahna Husband 02/27/2018, 11:10 AM

## 2018-02-27 NOTE — Consult Note (Addendum)
Hospital Consult    Reason for Consult:  Abdominal pain, splenic infarct, hepatic artery dissection Referring Physician:  Dr. Olevia Bowens MRN #:  [id number]  History of Present Illness: This is a 47 y.o. female, transferred to [facility].  She was initially evaluated [facility] with mid epigastric pain.  She states this pain started Saturday night.  She had a similar pain after childbirth several years ago underwent cholecystectomy and gallbladder was essentially normal.  Unfortunately this was comp gated with bile duct leak requiring stenting.  Only other abdominal surgeries include C-section.  She does have multiple sclerosis that affects mostly her left side.  She does not take any blood thinners.  She has not had any recent fevers or chills.  She has no history of vascular issues including no strokes no MIs and has no family history.  She is a lifelong non-smoker.  Pain does not radiate remains midepigastric. She has no pain in her left upper right upper quadrant.  She has not been able to eat since Saturday night.  She denies any vomiting or nausea.  Past Medical History:  Diagnosis Date  . Anxiety and depression   . Headache(784.0)   . Hypertension   . Multiple sclerosis [facility]) March 1997    Past Surgical History:  Procedure Laterality Date  . bone removed from left wrist     . BREAST BIOPSY     teenager  . CESAREAN SECTION  [id number]   x2  . CHOLECYSTECTOMY  2002  . stent in bile duct     . TYMPANOSTOMY TUBE PLACEMENT     x2 as a child    Allergies  Allergen Reactions  . Codeine     Prior to Admission medications   Medication Sig Start Date End Date Taking? Authorizing Provider  ALPRAZolam Duanne Moron) 0.5 MG tablet Take 30 minutes prior to procedure 10/14/15  Yes Carole Civil, MD  buPROPion (WELLBUTRIN XL) 150 MG 24 hr tablet Take 1 tablet by mouth daily. 02/09/18  Yes [provider]  losartan (COZAAR) 50 MG tablet Take 100 mg by mouth daily.    Yes  [provider]  sertraline (ZOLOFT) 50 MG tablet Take 50 mg by mouth 3 (three) times daily. 02/17/18  Yes [provider]  topiramate (TOPAMAX) 25 MG tablet Take 2 tablets by mouth 2 (two) times daily. 02/09/18  Yes [provider]  acyclovir (ZOVIRAX) 400 MG tablet Take 1 tablet by mouth as needed.    [provider]    Social History   Socioeconomic History  . Marital status: Married    Spouse name: Yvone Neu  . Number of children: 3  . Years of education: College  . Highest education level: Not on file  Occupational History    Comment: n/a  Social Needs  . Financial resource strain: Not on file  . Food insecurity:    Worry: Not on file    Inability: Not on file  . Transportation needs:    Medical: Not on file    Non-medical: Not on file  Tobacco Use  . Smoking status: Never Smoker  . Smokeless tobacco: Never Used  Substance and Sexual Activity  . Alcohol use: No  . Drug use: No  . Sexual activity: Not on file  Lifestyle  . Physical activity:    Days per week: Not on file    Minutes per session: Not on file  . Stress: Not on file  Relationships  . Social connections:  Talks on phone: Not on file    Gets together: Not on file    Attends religious service: Not on file    Active member of club or organization: Not on file    Attends meetings of clubs or organizations: Not on file    Relationship status: Not on file  . Intimate partner violence:    Fear of current or ex partner: Not on file    Emotionally abused: Not on file    Physically abused: Not on file    Forced sexual activity: Not on file  Other Topics Concern  . Not on file  Social History Narrative   Pt lives at home with her spouse and her children.    She does consume caffeine.     Family History  Problem Relation Age of Onset  . Hypertension Other     ROS: [x]  Positive   [ ]  Negative   [ ]  All sytems reviewed and are negative  Cardiovascular: []  chest  pain/pressure []  palpitations []  SOB lying flat []  DOE []  pain in legs while walking []  pain in legs at rest []  pain in legs at night []  non-healing ulcers []  hx of DVT []  swelling in legs  Pulmonary: []  productive cough []  asthma/wheezing []  home O2  Neurologic: []  weakness in []  arms []  legs []  numbness in []  arms []  legs []  hx of CVA []  mini stroke [] difficulty speaking or slurred speech []  temporary loss of vision in one eye []  dizziness  Hematologic: []  hx of cancer []  bleeding problems []  problems with blood clotting easily  Endocrine:   []  diabetes []  thyroid disease  GI [x]  abdominal pain []  blood in stool  GU: []  CKD/renal failure []  HD--[]  M/W/F or []  T/T/S []  burning with urination []  blood in urine  Psychiatric: []  anxiety []  depression  Musculoskeletal: []  arthritis []  joint pain  Integumentary: []  rashes []  ulcers  Constitutional: []  fever []  chills   Physical Examination  Vitals:   02/27/18 0030 02/27/18 0128  BP: 110/78 123/86  Pulse:  66  Resp: 17 20  Temp:  98.5 F (36.9 C)  SpO2:  99%   Body mass index is 33.02 kg/m.  General:  WDWN in NAD HENT: WNL, normocephalic Pulmonary: normal non-labored breathing Cardiac: Regular rate and rhythm, palpable radial and pedal pulses palpable popliteal pulses bilaterally Abdomen:  Her abdomen is soft there is no tenderness in the lower quadrants or in the bilateral upper quadrants.  She is significantly tender in the midepigastrium Extremities: No clubbing cyanosis edema Musculoskeletal: no muscle wasting or atrophy  Neurologic: A&O X 3; Appropriate Affect ; SENSATION: normal; MOTOR FUNCTION:  moving all extremities equally. Speech is fluent/normal   CBC    Component Value Date/Time   WBC 12.6 (H) 02/26/2018 1848   RBC 4.29 02/26/2018 1848   HGB 11.4 (L) 02/26/2018 1848   HCT 34.7 (L) 02/26/2018 1848   PLT 212 02/26/2018 1848   MCV 80.9 02/26/2018 1848   MCH 26.6 02/26/2018  1848   MCHC 32.9 02/26/2018 1848   RDW 15.3 02/26/2018 1848   RDW 14.7 07/02/2013 1335   LYMPHSABS 0.9 02/26/2018 1848   LYMPHSABS 0.3 (L) 07/02/2013 1335   MONOABS 1.0 02/26/2018 1848   EOSABS 0.1 02/26/2018 1848   EOSABS 0.1 07/02/2013 1335   BASOSABS 0.0 02/26/2018 1848   BASOSABS 0.0 07/02/2013 1335    BMET    Component Value Date/Time   NA 138 02/26/2018 1848   NA 138  11/21/2012 1311   K 3.4 (L) 02/26/2018 1848   CL 109 02/26/2018 1848   CO2 22 02/26/2018 1848   GLUCOSE 133 (H) 02/26/2018 1848   BUN 15 02/26/2018 1848   BUN 6 11/21/2012 1311   CREATININE 1.09 (H) 02/26/2018 1848   CALCIUM 9.0 02/26/2018 1848   GFRNONAA 60 (L) 02/26/2018 1848   GFRAA >60 02/26/2018 1848    COAGS: Lab Results  Component Value Date   INR 1.23 02/26/2018     Non-Invasive Vascular Imaging:   IMPRESSION: 1. 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 2. Abnormal appearance of the celiac axis and branches. Favor vasculitis. Especially within the common hepatic artery, a component of dissection is also a concern. Dedicated CTA or direct angiogram may be informative. 3. Hepatic steatosis and heterogeneous hepatic enhancement, presumably secondary to the common hepatic artery process. Cannot exclude mild cirrhosis. Correlate with risk factors.    ASSESSMENT/PLAN: This is a 47 y.o. female admitted with epigastric pain noted to have splenic infarct.  She has an abnormality of the celiac axis involving the common hepatic artery.  Her first set of labs did not demonstrate any elevated liver function tests.  I have ordered a sed rate and CRP as well with concern for vasculitis.  We will follow-up her morning labs.  An echo has been ordered to rule out embolic disease from cardiac source.    Daisy Mcneel C. 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  We will still check echo and would continue on heparin for this time.  Consider steroids to treat vasculitis etiology.  Servando Snare

## 2018-02-28 DIAGNOSIS — E876 Hypokalemia: Secondary | ICD-10-CM

## 2018-02-28 LAB — HEPATITIS PANEL, ACUTE
HCV Ab: <0.1 s/co ratio (ref 0.0–0.9)
HEP A IGM: NEGATIVE
HEP B C IGM: NEGATIVE
Hepatitis B Surface Ag: NEGATIVE

## 2018-02-28 LAB — COMPREHENSIVE METABOLIC PANEL
ALK PHOS: 83 U/L (ref 38–126)
ALT: 15 U/L (ref 0–44)
AST: 20 U/L (ref 15–41)
Albumin: 2.9 g/dL — ABNORMAL LOW (ref 3.5–5.0)
Anion gap: 6 (ref 5–15)
BUN: 6 mg/dL (ref 6–20)
CALCIUM: 8.9 mg/dL (ref 8.9–10.3)
CO2: 25 mmol/L (ref 22–32)
CREATININE: 0.98 mg/dL (ref 0.44–1.00)
Chloride: 107 mmol/L (ref 98–111)
Glucose, Bld: 107 mg/dL — ABNORMAL HIGH (ref 70–99)
Potassium: 4.2 mmol/L (ref 3.5–5.1)
Sodium: 138 mmol/L (ref 135–145)
TOTAL PROTEIN: 6.3 g/dL — AB (ref 6.5–8.1)
Total Bilirubin: 0.6 mg/dL (ref 0.3–1.2)

## 2018-02-28 LAB — CBC WITH DIFFERENTIAL/PLATELET
ABS IMMATURE GRANULOCYTES: 0 10*3/uL (ref 0.0–0.1)
BASOS PCT: 1 %
Basophils Absolute: 0.1 10*3/uL (ref 0.0–0.1)
Eosinophils Absolute: 0.1 10*3/uL (ref 0.0–0.7)
Eosinophils Relative: 1 %
HCT: 32.2 % — ABNORMAL LOW (ref 36.0–46.0)
HEMOGLOBIN: 9.8 g/dL — AB (ref 12.0–15.0)
Immature Granulocytes: 0 %
Lymphocytes Relative: 12 %
Lymphs Abs: 1.3 10*3/uL (ref 0.7–4.0)
MCH: 25.4 pg — AB (ref 26.0–34.0)
MCHC: 30.4 g/dL (ref 30.0–36.0)
MCV: 83.4 fL (ref 78.0–100.0)
MONO ABS: 0.9 10*3/uL (ref 0.1–1.0)
MONOS PCT: 8 %
NEUTROS ABS: 8.3 10*3/uL — AB (ref 1.7–7.7)
Neutrophils Relative %: 78 %
Platelets: 169 10*3/uL (ref 150–400)
RBC: 3.86 MIL/uL — ABNORMAL LOW (ref 3.87–5.11)
RDW: 14.7 % (ref 11.5–15.5)
WBC: 10.5 10*3/uL (ref 4.0–10.5)

## 2018-02-28 LAB — HEPARIN LEVEL (UNFRACTIONATED): HEPARIN UNFRACTIONATED: 0.44 [IU]/mL (ref 0.30–0.70)

## 2018-02-28 LAB — ANTINUCLEAR ANTIBODIES, IFA: ANA Ab, IFA: NEGATIVE

## 2018-02-28 LAB — ANCA TITERS
Atypical P-ANCA titer: <1:20 {titer}
P-ANCA: <1:20 {titer}

## 2018-02-28 MED ORDER — OXYCODONE-ACETAMINOPHEN 5-325 MG PO TABS: 1.0000 | ORAL_TABLET | Freq: Three times a day (TID) | ORAL | 0 refills | Status: AC | PRN

## 2018-02-28 MED ORDER — OXYCODONE-ACETAMINOPHEN 5-325 MG PO TABS
1.0000 | ORAL_TABLET | Freq: Four times a day (QID) | ORAL | Status: DC | PRN
Start: 2018-02-28 — End: 2018-03-01
  Administered 2018-02-28 – 2018-03-01 (×3): 1 via ORAL
  Filled 2018-02-28 (×2): qty 1
  Filled 2018-02-28 (×2): qty 2

## 2018-02-28 MED ORDER — ASPIRIN EC 81 MG PO TBEC
81.0000 mg | DELAYED_RELEASE_TABLET | Freq: Every day | ORAL | Status: DC
  Administered 2018-02-28 – 2018-03-01 (×2): 81 mg via ORAL
  Filled 2018-02-28 (×2): qty 1

## 2018-02-28 MED ORDER — PANTOPRAZOLE SODIUM 40 MG PO TBEC: 40.0000 mg | DELAYED_RELEASE_TABLET | Freq: Every day | ORAL | 0 refills | Status: DC

## 2018-02-28 MED ORDER — PREDNISONE 10 MG PO TABS: 30.0000 mg | ORAL_TABLET | Freq: Two times a day (BID) | ORAL | 1 refills | Status: DC

## 2018-02-28 MED ORDER — ASPIRIN 81 MG PO TBEC: 81.0000 mg | DELAYED_RELEASE_TABLET | Freq: Every day | ORAL | 1 refills | Status: DC

## 2018-02-28 NOTE — Progress Notes (Addendum)
Called by nurse that vascular surgery recommended to keep patient 1 more day for pain control, patient discharged earlier, but complained of ongoing epigastric pain.  She is scared to go home as she is getting IV Dilaudid here.  Will change IV Dilaudid to p.o. Percocet as needed and observe 1 more day.  Likely will go home tomorrow morning.

## 2018-02-28 NOTE — Progress Notes (Addendum)
  Progress Note    02/28/2018 10:09 AM * No surgery found *  Subjective: Still having epigastric pain, denies left upper quadrant right shoulder pain Denies nausea vomiting  Vitals:   02/28/18 0401 02/28/18 0821  BP: 137/84 139/90  Pulse: 81 67  Resp: 18   Temp: 98 F (36.7 C) 98.8 F (37.1 C)  SpO2: 98% 97%    Physical Exam: Awake alert and oriented, she is eating Jell-O at the time of exam Nonlabored respirations Her abdomen is soft she has midepigastric pain only  Radial and pedal pulses bilaterally are palpable  CBC    Component Value Date/Time   WBC 10.5 02/28/2018 0700   RBC 3.86 (L) 02/28/2018 0700   HGB 9.8 (L) 02/28/2018 0700   HCT 32.2 (L) 02/28/2018 0700   PLT 169 02/28/2018 0700   MCV 83.4 02/28/2018 0700   MCH 25.4 (L) 02/28/2018 0700   MCHC 30.4 02/28/2018 0700   RDW 14.7 02/28/2018 0700   RDW 14.7 07/02/2013 1335   LYMPHSABS 1.3 02/28/2018 0700   LYMPHSABS 0.3 (L) 07/02/2013 1335   MONOABS 0.9 02/28/2018 0700   EOSABS 0.1 02/28/2018 0700   EOSABS 0.1 07/02/2013 1335   BASOSABS 0.1 02/28/2018 0700   BASOSABS 0.0 07/02/2013 1335    BMET    Component Value Date/Time   NA 138 02/28/2018 0700   NA 138 11/21/2012 1311   K 4.2 02/28/2018 0700   CL 107 02/28/2018 0700   CO2 25 02/28/2018 0700   GLUCOSE 107 (H) 02/28/2018 0700   BUN 6 02/28/2018 0700   BUN 6 11/21/2012 1311   CREATININE 0.98 02/28/2018 0700   CALCIUM 8.9 02/28/2018 0700   GFRNONAA >60 02/28/2018 0700   GFRAA >60 02/28/2018 0700    INR    Component Value Date/Time   INR 1.23 02/26/2018 2343     Intake/Output Summary (Last 24 hours) at 02/28/2018 1009 Last data filed at 02/28/2018 0946 Gross per 24 hour  Intake 4239.92 ml  Output -  Net 4239.92 ml     Assessment/plan:  47 y.o. female is here with splenic infarction with CT scan with notable stranding around her celiac axis occluded common hepatic artery reconstituted by gastroduodenal artery.  She has elevated sed  rate and CRP consistent with vasculitis and is being treated with steroids.  This time I think it is reasonable to stop her anticoagulation.  I would not repeat a CT scan now unless there are new symptoms given that I do not think surgical intervention is merited.  She will follow-up with rheumatology and can see me in 4-6 weeks with repeat CT unless symptoms merit evaluation more urgently.   Billyjoe Go C. Donzetta Matters, MD [facility] Office: [phone] Pager: [phone]  02/28/2018 10:09 AM

## 2018-02-28 NOTE — Discharge Summary (Signed)
Physician Discharge Summary  Amy Horton GYK:[id number] DOB: 27-Apr-1971 DOA: 02/26/2018  PCP: [facility]  Admit date: 02/26/2018 Discharge date: 02/28/2018  Time spent: 35* minutes  Recommendations for Outpatient Follow-up:  1. Follow-up rheumatology in 2 weeks 2. Take prednisone 30 mg twice a day until you are seen by rheumatologist and then follow her recommendations.   Discharge Diagnoses:  Principal Problem:   Infarction of spleen Active Problems:   Multiple sclerosis (HCC)   Anxiety and depression   Headache(784.0)   Hypertension   Normocytic anemia   Hypokalemia   Discharge Condition: Stable  Diet recommendation: Regular diet  Filed Weights   02/26/18 1707 02/27/18 0128 02/28/18 0401  Weight: 90.3 kg (199 lb) 90 kg (198 lb 6.4 oz) 89.6 kg (197 lb 8 oz)    History of present illness:  47 year old female with a history of multiple sclerosis, cholelithiasis, status post cholecystectomy, hypertension, depression came to hospital with epigastric pain which woke her up from sleep last night.  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  Vascular surgery was consulted, 2D echo has been ordered.    Hospital Course:   1. 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  Vascular surgery saw the patient and ordered CRP and sed rate which both were elevated consistent with vasculitis.  Vascular surgery has recommended to stop IV heparin and send patient home on aspirin.  He does not appear to be thrombi embolic disease at this time.   2. ?  Vasculitis-CT abdomen pelvis showed abnormality in the celiac axis consistent with vasculitis, sed rate and CRP are elevated.  I called and discussed with rheumatology Dr. Trudie Reed, who recommended prednisone 30 mg p.o. twice daily, also check ANA, ANCA  panel, hepatitis panel.  Patient can follow-up with Dr. Trudie Reed as outpatient.  Hepatitis panel is negative, HIV is negative.  ANA and ANCA panel are pending at this time. 3. Multiple sclerosis-stable, no exacerbation at this time. 4. Anxiety/ depression-continue sertraline 5. Headache-continue Topamax 6. Hypokalemia-replete 7. Normocytic anemia-hemoglobin is 9.8 , anemia panel shows iron deficiency anemia.  Work-up as outpatient, B12 level is 267      Procedures:  None   Consultations:  *vascular surgery*  Discharge Exam: Vitals:   02/28/18 0401 02/28/18 0821  BP: 137/84 139/90  Pulse: 81 67  Resp: 18   Temp: 98 F (36.7 C) 98.8 F (37.1 C)  SpO2: 98% 97%    General: Appears in no acute distress Cardiovascular: S1-S2, regular Respiratory: Clear to auscultation bilaterally  Discharge Instructions   Discharge Instructions    Diet - low sodium heart healthy   Complete by:  As directed    Increase activity slowly   Complete by:  As directed      Allergies as of 02/28/2018      Reactions   Codeine       Medication List    TAKE these medications   acyclovir 400 MG tablet Commonly known as:  ZOVIRAX Take 1 tablet by mouth as needed.   ALPRAZolam 0.5 MG tablet Commonly known as:  XANAX Take 30 minutes prior to procedure   aspirin 81 MG EC tablet Take 1 tablet (81 mg total) by mouth daily.   buPROPion 150 MG 24 hr tablet Commonly known as:  WELLBUTRIN XL Take 1 tablet by mouth daily.  losartan 50 MG tablet Commonly known as:  COZAAR Take 100 mg by mouth daily.   oxyCODONE-acetaminophen 5-325 MG tablet Commonly known as:  PERCOCET Take 1 tablet by mouth every 8 (eight) hours as needed for severe pain.   pantoprazole 40 MG tablet Commonly known as:  PROTONIX Take 1 tablet (40 mg total) by mouth daily.   predniSONE 10 MG tablet Commonly known as:  DELTASONE Take 3 tablets (30 mg total) by mouth 2 (two) times daily with a meal.   sertraline 50 MG  tablet Commonly known as:  ZOLOFT Take 50 mg by mouth 3 (three) times daily.   topiramate 25 MG tablet Commonly known as:  TOPAMAX Take 2 tablets by mouth 2 (two) times daily.      Allergies  Allergen Reactions  . Codeine    Follow-up Information    Gavin Pound, MD. Schedule an appointment as soon as possible for a visit in 2 week(s).   Specialty:  Rheumatology Contact information: [place] [address] [phone]            The results of significant diagnostics from this hospitalization (including imaging, microbiology, ancillary and laboratory) are listed below for reference.    Significant Diagnostic Studies: Ct Abdomen Pelvis W Contrast  Result Date: 02/26/2018 CLINICAL DATA:  Epigastric abdominal pain.  Prior cholecystectomy. EXAM: CT ABDOMEN AND PELVIS WITH CONTRAST TECHNIQUE: Multidetector CT imaging of the abdomen and pelvis was performed using the standard protocol following bolus administration of intravenous contrast. CONTRAST:  150mL ISOVUE-300 IOPAMIDOL (ISOVUE-300) INJECTION 61% COMPARISON:  Plain films of earlier today.  CT 10/08/2016 FINDINGS: Lower chest: Atelectasis or scar at the left lung base. Normal heart size without pericardial or pleural effusion. Hepatobiliary: Heterogeneous enhancement of the liver. Underlying hepatic steatosis. Mildly irregular hepatic capsule. Cholecystectomy, without biliary ductal dilatation. Pancreas: Normal, without mass or ductal dilatation. Spleen: Infarction of greater than 1/2 of the spleen, primarily anteriorly and inferiorly. Adrenals/Urinary Tract: Normal adrenal glands. Normal kidneys, without hydronephrosis. Normal urinary bladder. Stomach/Bowel: Normal stomach, without wall thickening. Normal colon, appendix, and terminal ileum. Normal small bowel. Vascular/Lymphatic: No evidence of aortic dissection. There is soft tissue thickening about the left side of the celiac axis on 26/2.  Significant stenosis throughout the common hepatic artery on 26/2 with noncalcified soft tissue density within. soft tissue thickening along or within the posterior aspect of the splenic artery on 21/2. Perivascular edema in this region. A branch of the distal splenic artery supplying the anterior and inferior spleen is nonenhancing and presumably occluded on 22/2 and coronal image 49 the SMA and IMA are both patent. Patent portal and splenic veins. Mild calcified aortic atherosclerosis. No abdominopelvic adenopathy. Reproductive: Normal uterus and adnexa.  Tampon in place. Other: No significant free fluid. Musculoskeletal: No acute osseous abnormality. IMPRESSION: 1. 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 2. Abnormal appearance of the celiac axis and branches. Favor vasculitis. Especially within the common hepatic artery, a component of dissection is also a concern. Dedicated CTA or direct angiogram may be informative. 3. Hepatic steatosis and heterogeneous hepatic enhancement, presumably secondary to the common hepatic artery process. Cannot exclude mild cirrhosis. Correlate with risk factors. These results were called by telephone at the time of interpretation on 02/26/2018 at 9:00 pm to Dr. Merrily Pew , who verbally acknowledged these results. Electronically Signed   By: Abigail Miyamoto M.D.   On: 02/26/2018 21:02    Microbiology: No  results found for this or any previous visit (from the past 240 hour(s)).   Labs: Basic Metabolic Panel: Recent Labs  Lab 02/26/18 1848 02/27/18 0608 02/28/18 0700  NA 138 140 138  K 3.4* 3.3* 4.2  CL 109 107 107  CO2 22 25 25   GLUCOSE 133* 119* 107*  BUN 15 10 6   CREATININE 1.09* 1.09* 0.98  CALCIUM 9.0 8.4* 8.9  MG 2.1  --   --   PHOS 2.6  --   --    Liver Function Tests: Recent Labs  Lab 02/26/18 1848 02/27/18 0608 02/28/18 0700  AST 33 24 20  ALT 15 14 15   ALKPHOS 88 87 83  BILITOT 0.6 0.6 0.6   PROT 7.7 7.0 6.3*  ALBUMIN 3.7 3.2* 2.9*   Recent Labs  Lab 02/26/18 1848  LIPASE 29   No results for input(s): AMMONIA in the last 168 hours. CBC: Recent Labs  Lab 02/26/18 1848 02/27/18 0608 02/28/18 0700  WBC 12.6* 7.9 10.5  NEUTROABS 10.6* 5.7 8.3*  HGB 11.4* 10.4* 9.8*  HCT 34.7* 33.6* 32.2*  MCV 80.9 84.2 83.4  PLT 212 187 169   Cardiac Enzymes: Recent Labs  Lab 02/26/18 1848  TROPONINI <0.03        Signed:  Oswald Hillock MD.  [facility] 02/28/2018, 9:49 AM

## 2018-02-28 NOTE — Progress Notes (Signed)
ANTICOAGULATION CONSULT NOTE - Follow Up Consult  Pharmacy Consult for Heparin Indication: splenic artery thrombus  Allergies  Allergen Reactions  . Codeine     Patient Measurements: Height: 5\' 5"  (165.1 cm) Weight: 198 lb 6.4 oz (90 kg) IBW/kg (Calculated) : 57 Heparin Dosing Weight: 77 kg  Vital Signs: Temp: 98.3 F (36.8 C) (07/15 2357) Temp Source: Oral (07/15 2357) BP: 129/88 (07/15 2357) Pulse Rate: 73 (07/15 2357)  Labs: Recent Labs    02/26/18 1848 02/26/18 2343 02/27/18 0608 02/27/18 1404 02/27/18 2154  HGB 11.4*  --  10.4*  --   --   HCT 34.7*  --  33.6*  --   --   PLT 212  --  187  --   --   APTT  --  34  --   --   --   LABPROT  --  15.4*  --   --   --   INR  --  1.23  --   --   --   HEPARINUNFRC  --   --  <0.10* <0.10* 0.48  CREATININE 1.09*  --  1.09*  --   --   TROPONINI <0.03  --   --   --   --     Estimated Creatinine Clearance: 71.5 mL/min (A) (by C-G formula based on SCr of 1.09 mg/dL (H)).  Assessment:  47 yr old female on IV heparin for splenic artery thrombosis.  Started on IV heparin at [facility] and transferred to [facility].   Heparin level is now therapeutic Goal of Therapy:  Heparin level 0.3-0.7 units/ml Monitor platelets by anticoagulation protocol: Yes   Plan:   Continue Heparin 1500 units/hr  Daily heparin level and CBC.  Sammantha Mehlhaff Poteet, RPh  02/28/2018,12:17 AM

## 2018-02-28 NOTE — Progress Notes (Signed)
Pt heparin drip stopped as ordered. Pt informed RN she has question for Vascular surgery MD and requested for him to come back to room to speak with her. MD paged and notified. Pt undated and will continue to closely monitor. Delia Heady RN

## 2018-02-28 NOTE — Progress Notes (Signed)
Pt requested for MD to change her diet to regular food; MD paged and notified; new orders received. Delia Heady RN

## 2018-03-01 LAB — CBC WITH DIFFERENTIAL/PLATELET
ABS IMMATURE GRANULOCYTES: 0.1 10*3/uL (ref 0.0–0.1)
Basophils Absolute: 0 10*3/uL (ref 0.0–0.1)
Basophils Relative: 0 %
EOS ABS: 0 10*3/uL (ref 0.0–0.7)
Eosinophils Relative: 0 %
HEMATOCRIT: 30.5 % — AB (ref 36.0–46.0)
Hemoglobin: 9.6 g/dL — ABNORMAL LOW (ref 12.0–15.0)
Immature Granulocytes: 1 %
LYMPHS ABS: 1.1 10*3/uL (ref 0.7–4.0)
Lymphocytes Relative: 9 %
MCH: 26.2 pg (ref 26.0–34.0)
MCHC: 31.5 g/dL (ref 30.0–36.0)
MCV: 83.1 fL (ref 78.0–100.0)
MONOS PCT: 7 %
Monocytes Absolute: 0.9 10*3/uL (ref 0.1–1.0)
NEUTROS ABS: 10.4 10*3/uL — AB (ref 1.7–7.7)
NEUTROS PCT: 83 %
PLATELETS: 177 10*3/uL (ref 150–400)
RBC: 3.67 MIL/uL — AB (ref 3.87–5.11)
RDW: 14.8 % (ref 11.5–15.5)
WBC: 12.5 10*3/uL — AB (ref 4.0–10.5)

## 2018-03-01 LAB — COMPREHENSIVE METABOLIC PANEL
ALBUMIN: 2.8 g/dL — AB (ref 3.5–5.0)
ALT: 13 U/L (ref 0–44)
AST: 11 U/L — AB (ref 15–41)
Alkaline Phosphatase: 72 U/L (ref 38–126)
Anion gap: 8 (ref 5–15)
BILIRUBIN TOTAL: 0.4 mg/dL (ref 0.3–1.2)
BUN: 7 mg/dL (ref 6–20)
CHLORIDE: 112 mmol/L — AB (ref 98–111)
CO2: 21 mmol/L — ABNORMAL LOW (ref 22–32)
CREATININE: 0.94 mg/dL (ref 0.44–1.00)
Calcium: 8.9 mg/dL (ref 8.9–10.3)
GFR calc Af Amer: >60 mL/min (ref >60)
GLUCOSE: 120 mg/dL — AB (ref 70–99)
POTASSIUM: 4.1 mmol/L (ref 3.5–5.1)
Sodium: 141 mmol/L (ref 135–145)
Total Protein: 6.3 g/dL — ABNORMAL LOW (ref 6.5–8.1)

## 2018-03-01 MED ORDER — FERROUS SULFATE 325 (65 FE) MG PO TBEC: 325.0000 mg | DELAYED_RELEASE_TABLET | Freq: Every day | ORAL | 0 refills | Status: DC

## 2018-03-01 MED ORDER — FAMOTIDINE 20 MG PO TABS
20.0000 mg | ORAL_TABLET | Freq: Two times a day (BID) | ORAL | Status: DC
  Administered 2018-03-01: 20 mg via ORAL
  Filled 2018-03-01: qty 1

## 2018-03-01 NOTE — Progress Notes (Signed)
Pt tolerating po meds and diet well with no complaints. Pt denies any discomfort from eating. Will continue to closely monitor. Delia Heady RN

## 2018-03-01 NOTE — Progress Notes (Signed)
  Progress Note    03/01/2018 9:55 AM * No surgery found *  Subjective: Tolerating diet  Vitals:   02/28/18 1945 03/01/18 0457  BP: (!) 150/84 (!) 150/93  Pulse: 61 64  Resp: 16 18  Temp: 98.2 F (36.8 C) 98 F (36.7 C)  SpO2: 100% 100%    Physical Exam: Awake alert oriented Nonlabored respirations Abdomen is soft there is tenderness in the epigastric region nowhere else Palpable pedal and radial pulses bilaterally  CBC    Component Value Date/Time   WBC 12.5 (H) 03/01/2018 0442   RBC 3.67 (L) 03/01/2018 0442   HGB 9.6 (L) 03/01/2018 0442   HCT 30.5 (L) 03/01/2018 0442   PLT 177 03/01/2018 0442   MCV 83.1 03/01/2018 0442   MCH 26.2 03/01/2018 0442   MCHC 31.5 03/01/2018 0442   RDW 14.8 03/01/2018 0442   RDW 14.7 07/02/2013 1335   LYMPHSABS 1.1 03/01/2018 0442   LYMPHSABS 0.3 (L) 07/02/2013 1335   MONOABS 0.9 03/01/2018 0442   EOSABS 0.0 03/01/2018 0442   EOSABS 0.1 07/02/2013 1335   BASOSABS 0.0 03/01/2018 0442   BASOSABS 0.0 07/02/2013 1335    BMET    Component Value Date/Time   NA 141 03/01/2018 0442   NA 138 11/21/2012 1311   K 4.1 03/01/2018 0442   CL 112 (H) 03/01/2018 0442   CO2 21 (L) 03/01/2018 0442   GLUCOSE 120 (H) 03/01/2018 0442   BUN 7 03/01/2018 0442   BUN 6 11/21/2012 1311   CREATININE 0.94 03/01/2018 0442   CALCIUM 8.9 03/01/2018 0442   GFRNONAA >60 03/01/2018 0442   GFRAA >60 03/01/2018 0442    INR    Component Value Date/Time   INR 1.23 02/26/2018 2343     Intake/Output Summary (Last 24 hours) at 03/01/2018 0955 Last data filed at 03/01/2018 0940 Gross per 24 hour  Intake 2378.22 ml  Output 500 ml  Net 1878.22 ml     Assessment/plan:  47 y.o. female is here with epigastric pain CT scan concerning for vasculitis.  She will follow-up with rheumatology as an outpatient and will be on high-dose steroids until that point.  She should take baby aspirin daily.  I will see her in 4 to 6 weeks with CT angios for further  evaluation of her celiac axis.  Tirrell Buchberger C. Donzetta Matters, MD [facility] Office: [phone] Pager: [phone]  03/01/2018 9:55 AM

## 2018-03-01 NOTE — Progress Notes (Signed)
Pt discharge education and instructions completed with pt and spouse at bedside. Both voices understanding and denies any questions. Pt IV and telemetry removed; pt discharge home with spouse to transport her home. Pt handed her percocet prescription by MD and to pick up electronically sent prescriptions from preferred pharmacy on file. Pt transported off unit via wheelchair with belongings and spouse to the side. Delia Heady RN

## 2018-03-01 NOTE — Plan of Care (Signed)
  Problem: Pain Managment: Goal: General experience of comfort will improve Outcome: Progressing   

## 2018-03-01 NOTE — Progress Notes (Addendum)
Patient had one dose of pain medication for pain 4/10, "pain not nearly as bad". Patient eating and tolerating food today- no problems with nausea or increased abdominal pain, LR still running at 125.  Patient does state she has trouble swallowing and request larger pills to be broken in half.

## 2018-03-01 NOTE — Progress Notes (Signed)
Patient resting, husband at bedside. VSS. Report given to current RN.

## 2018-03-01 NOTE — Discharge Summary (Signed)
Physician Discharge Summary  Amy Horton ZYS:[id number] DOB: 1970/09/14 DOA: 02/26/2018  PCP: [facility]  Admit date: 02/26/2018 Discharge date: 03/01/2018  Time spent: 35 minutes  Recommendations for Outpatient Follow-up:  1. Follow up CBC/CMP 2. Ensure follow up with rheumatology and vascular surgery (rheum f/u scheduled for 7/25 at 1:30) 3. Continue steroids until follow up with rheum.  Continue baby aspirin.  4. Consider checking MMA as outpatient for anemia with low normal B12.  Iron deficiency anemia, starting iron. 5. Follow up possible steatosis/cirrhosis as outpatient  Discharge Diagnoses:  Principal Problem:   Infarction of spleen Active Problems:   Multiple sclerosis (HCC)   Anxiety and depression   Headache(784.0)   Hypertension   Normocytic anemia   Hypokalemia   Discharge Condition: stable  Diet recommendation: heart healthy  Filed Weights   02/27/18 0128 02/28/18 0401 03/01/18 0457  Weight: 90 kg (198 lb 6.4 oz) 89.6 kg (197 lb 8 oz) 90.5 kg (199 lb 9.6 oz)    History of present illness:  47 year old female with a history of multiple sclerosis, cholelithiasis, status post cholecystectomy, hypertension, depression came to hospital with epigastric pain which woke her up from sleep last night. 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 Vascular surgery was consulted, 2D echo has been ordered.  Hospital Course:  1. 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 Vascular surgery saw the patient and with imaging findings c/f vasculitis followed up ESR/CRP which were both elevated.  Echo with normal EF, mild diastolic dysfunction (no noted evidence of thromboembolic disease).  Discussed with vascular surgery who suspect  findings c/f thrombus likely inflammation related to vasculitis, so recommending d/c anticoagulation and start baby aspirin at discharge.  Follow up with rheum as outpatient.  2. 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 Dr. Darrick Meigs called and discussed with rheumatology Dr. Ronald Lobo recommended prednisone 30 mg p.o. twice daily, also check ANA, ANCA panel, hepatitis panel (these all negative at time of discharge).  1. She has follow up appointment scheduled next week 2. Concern for dissection within hepatic artery, has follow up with vascular surgery in 4-6 weeks  3. Multiple sclerosis-stable, no exacerbation at this time.  4. Anxiety/depression-continue sertraline  5. Headache-continue Topamax  6. Hypokalemia-replete  7. Normocytic anemia-hemoglobin is 9.8 , anemia panel shows iron deficiency anemia.  Work-up as outpatient, B12 level is 267  Procedures: Study Conclusions  - Left ventricle: The cavity size was normal. Wall thickness was   normal. Systolic function was normal. The estimated ejection   fraction was in the range of 55% to 60%. Wall motion was normal;   there were no regional wall motion abnormalities. Doppler   parameters are consistent with abnormal left ventricular   relaxation (grade 1 diastolic dysfunction). - Aortic valve: There was trivial regurgitation. - Ascending aorta: The ascending aorta was mildly dilated.  Impressions:  - Normal LV systolic function; mild diastolic dysfunction; trace   AI; mildly dilated ascending aorta.  Consultations:  vascular  Discharge Exam: Vitals:   02/28/18 1945 03/01/18 0457  BP: (!) 150/84 (!) 150/93  Pulse: 61 64  Resp: 16 18  Temp: 98.2 F (36.8 C) 98 F (36.7 C)  SpO2: 100% 100%   Persistent epigastric pain, improving  gradually.  General: No acute distress. Cardiovascular: Heart sounds show a regular rate, and rhythm.  Lungs:  Clear to auscultation bilaterally with good air movement. Abdomen: Soft, TTP in epigastric region, nondistended Neurological: Alert and oriented 3. Moves all extremities 4. Cranial nerves II through XII grossly intact. Skin: Warm and dry. No rashes or lesions. Extremities: No clubbing or cyanosis. No edema.  Psychiatric: Mood and affect are normal. Insight and judgment are appropriate.  Discharge Instructions   Discharge Instructions    Call MD for:  difficulty breathing, headache or visual disturbances   Complete by:  As directed    Call MD for:  extreme fatigue   Complete by:  As directed    Call MD for:  persistant dizziness or light-headedness   Complete by:  As directed    Call MD for:  persistant nausea and vomiting   Complete by:  As directed    Call MD for:  redness, tenderness, or signs of infection (pain, swelling, redness, odor or green/yellow discharge around incision site)   Complete by:  As directed    Call MD for:  severe uncontrolled pain   Complete by:  As directed    Call MD for:  temperature >100.4   Complete by:  As directed    Diet - low sodium heart healthy   Complete by:  As directed    Diet - low sodium heart healthy   Complete by:  As directed    Discharge instructions   Complete by:  As directed    You were seen for a splenic infarction which was due to a blood clot.  There was evidence of vasculitis (inflammation of blood vessels).  We've started you on prednisone.  Take this until you follow up with rheumatology (appointment scheduled for 1:30 on Thursday of next week).  Take aspirin daily.  Follow up with vascular surgery in 4-6 weeks.   You also had possible fatty liver or mild cirrhosis, please follow this up with your primary care provider.   Return if you have new, recurrent, or worsening symptoms.  Please ask your PCP to request records from this hospitalization so they know what was done and what the next steps will be.   Increase  activity slowly   Complete by:  As directed    Increase activity slowly   Complete by:  As directed      Allergies as of 03/01/2018      Reactions   Codeine       Medication List    TAKE these medications   acyclovir 400 MG tablet Commonly known as:  ZOVIRAX Take 1 tablet by mouth as needed.   ALPRAZolam 0.5 MG tablet Commonly known as:  XANAX Take 30 minutes prior to procedure   aspirin 81 MG EC tablet Take 1 tablet (81 mg total) by mouth daily.   buPROPion 150 MG 24 hr tablet Commonly known as:  WELLBUTRIN XL Take 1 tablet by mouth daily.   losartan 50 MG tablet Commonly known as:  COZAAR Take 100 mg by mouth daily.   oxyCODONE-acetaminophen 5-325 MG tablet Commonly known as:  PERCOCET Take 1 tablet by mouth every 8 (eight) hours as needed for severe pain.   pantoprazole 40 MG tablet Commonly known as:  PROTONIX Take 1 tablet (40 mg total) by mouth daily.   predniSONE 10 MG tablet Commonly known as:  DELTASONE Take 3 tablets (30 mg total) by mouth 2 (two) times daily with a meal.  sertraline 50 MG tablet Commonly known as:  ZOLOFT Take 50 mg by mouth 3 (three) times daily.   topiramate 25 MG tablet Commonly known as:  TOPAMAX Take 2 tablets by mouth 2 (two) times daily.      Allergies  Allergen Reactions  . Codeine    Follow-up Information    Gavin Pound, MD. Schedule an appointment as soon as possible for a visit in 2 week(s).   Specialty:  Rheumatology Why:  Please call to confirm your appointment You're scheduled for 7/25 at 1:30 PM Contact information: [place] [address] [phone]        [facility]. Go on 03/06/2018.   Why:  @1 :45pm Contact information: [place] [address] [phone]        Waynetta Sandy, MD In 4 weeks.   Specialties:  Vascular Surgery, Cardiology Why:  Office will call you to arrange your appt (sent) Contact information: [place] [address] [phone]            The results of significant diagnostics from this hospitalization (including imaging, microbiology, ancillary and laboratory) are listed below for reference.    Significant Diagnostic Studies: Ct Abdomen Pelvis W Contrast  Result Date: 02/26/2018 CLINICAL DATA:  Epigastric abdominal pain.  Prior cholecystectomy. EXAM: CT ABDOMEN AND PELVIS WITH CONTRAST TECHNIQUE: Multidetector CT imaging of the abdomen and pelvis was performed using the standard protocol following bolus administration of intravenous contrast. CONTRAST:  153m ISOVUE-300 IOPAMIDOL (ISOVUE-300) INJECTION 61% COMPARISON:  Plain films of earlier today.  CT 10/08/2016 FINDINGS: Lower chest: Atelectasis or scar at the left lung base. Normal heart size without pericardial or pleural effusion. Hepatobiliary: Heterogeneous enhancement of the liver. Underlying hepatic steatosis. Mildly irregular hepatic capsule. Cholecystectomy, without biliary ductal dilatation. Pancreas: Normal, without mass or ductal dilatation. Spleen: Infarction of greater than 1/2 of the spleen, primarily anteriorly and inferiorly. Adrenals/Urinary Tract: Normal adrenal glands. Normal kidneys, without hydronephrosis. Normal urinary bladder. Stomach/Bowel: Normal stomach, without wall thickening. Normal colon, appendix, and terminal ileum. Normal small bowel. Vascular/Lymphatic: No evidence of aortic dissection. There is soft tissue thickening about the left side of the celiac axis on 26/2. Significant stenosis throughout the common hepatic artery on 26/2 with noncalcified soft tissue density within. soft tissue thickening along or within the posterior aspect of the splenic artery on 21/2. Perivascular edema in this region. A branch of the distal splenic artery supplying the anterior and inferior spleen is nonenhancing and presumably occluded on 22/2 and coronal image 49 the SMA and IMA are both patent. Patent portal  and splenic veins. Mild calcified aortic atherosclerosis. No abdominopelvic adenopathy. Reproductive: Normal uterus and adnexa.  Tampon in place. Other: No significant free fluid. Musculoskeletal: No acute osseous abnormality. IMPRESSION: 1. 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 2. Abnormal appearance of the celiac axis and branches. Favor vasculitis. Especially within the common hepatic artery, a component of dissection is also a concern. Dedicated CTA or direct angiogram may be informative. 3. Hepatic steatosis and heterogeneous hepatic enhancement, presumably secondary to the common hepatic artery process. Cannot exclude mild cirrhosis. Correlate with risk factors. These results were called by telephone at the time of interpretation on 02/26/2018 at 9:00 pm to Dr. JMerrily Pew, who verbally acknowledged these results. Electronically Signed   By: KAbigail MiyamotoM.D.   On: 02/26/2018 21:02    Microbiology: No results found for this  or any previous visit (from the past 240 hour(s)).   Labs: Basic Metabolic Panel: Recent Labs  Lab 02/26/18 1848 02/27/18 0608 02/28/18 0700 03/01/18 0442  NA 138 140 138 141  K 3.4* 3.3* 4.2 4.1  CL 109 107 107 112*  CO2 22 25 25  21*  GLUCOSE 133* 119* 107* 120*  BUN 15 10 6 7   CREATININE 1.09* 1.09* 0.98 0.94  CALCIUM 9.0 8.4* 8.9 8.9  MG 2.1  --   --   --   PHOS 2.6  --   --   --    Liver Function Tests: Recent Labs  Lab 02/26/18 1848 02/27/18 0608 02/28/18 0700 03/01/18 0442  AST 33 24 20 11*  ALT 15 14 15 13   ALKPHOS 88 87 83 72  BILITOT 0.6 0.6 0.6 0.4  PROT 7.7 7.0 6.3* 6.3*  ALBUMIN 3.7 3.2* 2.9* 2.8*   Recent Labs  Lab 02/26/18 1848  LIPASE 29   No results for input(s): AMMONIA in the last 168 hours. CBC: Recent Labs  Lab 02/26/18 1848 02/27/18 0608 02/28/18 0700 03/01/18 0442  WBC 12.6* 7.9 10.5 12.5*  NEUTROABS 10.6* 5.7 8.3* 10.4*  HGB 11.4* 10.4* 9.8* 9.6*  HCT 34.7*  33.6* 32.2* 30.5*  MCV 80.9 84.2 83.4 83.1  PLT 212 187 169 177   Cardiac Enzymes: Recent Labs  Lab 02/26/18 1848  TROPONINI <0.03   BNP: BNP (last 3 results) No results for input(s): BNP in the last 8760 hours.  ProBNP (last 3 results) No results for input(s): PROBNP in the last 8760 hours.  CBG: No results for input(s): GLUCAP in the last 168 hours.     Signed:  Fayrene Helper MD.  [facility] 03/01/2018, 1:06 PM

## 2018-03-02 ENCOUNTER — Telehealth: Payer: Self-pay | Admitting: Vascular Surgery

## 2018-03-02 ENCOUNTER — Other Ambulatory Visit: Payer: Self-pay

## 2018-03-02 DIAGNOSIS — D735 Infarction of spleen: Secondary | ICD-10-CM

## 2018-03-02 DIAGNOSIS — I776 Arteritis, unspecified: Secondary | ICD-10-CM

## 2018-03-02 LAB — MPO/PR-3 (ANCA) ANTIBODIES: ANCA Proteinase 3: <3.5 U/mL (ref 0.0–3.5)

## 2018-03-02 NOTE — Consult Note (Signed)
            [facility] CM Primary Care Navigator  03/02/2018  Amy Horton [birth year] [id number]   Wenttosee patient at the bedside to identify possible discharge needs but she wasalreadydischargedhome per staff.  Per chart review,patientpresented with epigastric pain which woke her up from sleep andCT scan of the abdomen and pelvis showed splenic infarct due to thrombosis of distal splenic artery.   Primary care provider's office is listed as providing transition of care (TOC) follow-up.  Patient has discharge instruction to follow-up with primary care provider on 03/06/18.   For additional questions please contact:  Edwena Felty A. Sherrell Farish, BSN, RN-BC [facility] PRIMARY CARE Navigator Cell: [phone]

## 2018-03-02 NOTE — Telephone Encounter (Signed)
sch appt spk to pt 04/14/18 4pm f/u MD

## 2018-03-06 DIAGNOSIS — G43909 Migraine, unspecified, not intractable, without status migrainosus: Secondary | ICD-10-CM | POA: Diagnosis not present

## 2018-03-06 DIAGNOSIS — E1165 Type 2 diabetes mellitus with hyperglycemia: Secondary | ICD-10-CM | POA: Diagnosis not present

## 2018-03-06 DIAGNOSIS — D735 Infarction of spleen: Secondary | ICD-10-CM | POA: Diagnosis not present

## 2018-03-06 DIAGNOSIS — G35 Multiple sclerosis: Secondary | ICD-10-CM | POA: Diagnosis not present

## 2018-03-06 DIAGNOSIS — E782 Mixed hyperlipidemia: Secondary | ICD-10-CM | POA: Diagnosis not present

## 2018-03-06 DIAGNOSIS — I10 Essential (primary) hypertension: Secondary | ICD-10-CM | POA: Diagnosis not present

## 2018-03-06 DIAGNOSIS — E876 Hypokalemia: Secondary | ICD-10-CM | POA: Diagnosis not present

## 2018-03-06 DIAGNOSIS — F411 Generalized anxiety disorder: Secondary | ICD-10-CM | POA: Diagnosis not present

## 2018-03-06 DIAGNOSIS — F329 Major depressive disorder, single episode, unspecified: Secondary | ICD-10-CM | POA: Diagnosis not present

## 2018-03-09 DIAGNOSIS — E669 Obesity, unspecified: Secondary | ICD-10-CM | POA: Diagnosis not present

## 2018-03-09 DIAGNOSIS — I776 Arteritis, unspecified: Secondary | ICD-10-CM | POA: Diagnosis not present

## 2018-03-09 DIAGNOSIS — R5383 Other fatigue: Secondary | ICD-10-CM | POA: Diagnosis not present

## 2018-03-09 DIAGNOSIS — R1013 Epigastric pain: Secondary | ICD-10-CM | POA: Diagnosis not present

## 2018-03-09 DIAGNOSIS — D735 Infarction of spleen: Secondary | ICD-10-CM | POA: Diagnosis not present

## 2018-03-09 DIAGNOSIS — Z6832 Body mass index (BMI) 32.0-32.9, adult: Secondary | ICD-10-CM | POA: Diagnosis not present

## 2018-03-28 DIAGNOSIS — G35 Multiple sclerosis: Secondary | ICD-10-CM | POA: Diagnosis not present

## 2018-03-28 DIAGNOSIS — E669 Obesity, unspecified: Secondary | ICD-10-CM | POA: Diagnosis not present

## 2018-03-28 DIAGNOSIS — R1013 Epigastric pain: Secondary | ICD-10-CM | POA: Diagnosis not present

## 2018-03-28 DIAGNOSIS — Z6832 Body mass index (BMI) 32.0-32.9, adult: Secondary | ICD-10-CM | POA: Diagnosis not present

## 2018-03-28 DIAGNOSIS — D735 Infarction of spleen: Secondary | ICD-10-CM | POA: Diagnosis not present

## 2018-03-28 DIAGNOSIS — I776 Arteritis, unspecified: Secondary | ICD-10-CM | POA: Diagnosis not present

## 2018-03-28 DIAGNOSIS — R5383 Other fatigue: Secondary | ICD-10-CM | POA: Diagnosis not present

## 2018-03-31 DIAGNOSIS — G35 Multiple sclerosis: Secondary | ICD-10-CM | POA: Diagnosis not present

## 2018-03-31 DIAGNOSIS — G43909 Migraine, unspecified, not intractable, without status migrainosus: Secondary | ICD-10-CM | POA: Diagnosis not present

## 2018-03-31 DIAGNOSIS — E1165 Type 2 diabetes mellitus with hyperglycemia: Secondary | ICD-10-CM | POA: Diagnosis not present

## 2018-03-31 DIAGNOSIS — E876 Hypokalemia: Secondary | ICD-10-CM | POA: Diagnosis not present

## 2018-03-31 DIAGNOSIS — D735 Infarction of spleen: Secondary | ICD-10-CM | POA: Diagnosis not present

## 2018-03-31 DIAGNOSIS — I10 Essential (primary) hypertension: Secondary | ICD-10-CM | POA: Diagnosis not present

## 2018-03-31 DIAGNOSIS — E782 Mixed hyperlipidemia: Secondary | ICD-10-CM | POA: Diagnosis not present

## 2018-04-04 DIAGNOSIS — E1165 Type 2 diabetes mellitus with hyperglycemia: Secondary | ICD-10-CM | POA: Diagnosis not present

## 2018-04-04 DIAGNOSIS — E876 Hypokalemia: Secondary | ICD-10-CM | POA: Diagnosis not present

## 2018-04-04 DIAGNOSIS — I10 Essential (primary) hypertension: Secondary | ICD-10-CM | POA: Diagnosis not present

## 2018-04-04 DIAGNOSIS — E782 Mixed hyperlipidemia: Secondary | ICD-10-CM | POA: Diagnosis not present

## 2018-04-11 DIAGNOSIS — E782 Mixed hyperlipidemia: Secondary | ICD-10-CM | POA: Diagnosis not present

## 2018-04-11 DIAGNOSIS — Z6831 Body mass index (BMI) 31.0-31.9, adult: Secondary | ICD-10-CM | POA: Diagnosis not present

## 2018-04-11 DIAGNOSIS — E1165 Type 2 diabetes mellitus with hyperglycemia: Secondary | ICD-10-CM | POA: Diagnosis not present

## 2018-04-11 DIAGNOSIS — D735 Infarction of spleen: Secondary | ICD-10-CM | POA: Diagnosis not present

## 2018-04-11 DIAGNOSIS — I10 Essential (primary) hypertension: Secondary | ICD-10-CM | POA: Diagnosis not present

## 2018-04-11 DIAGNOSIS — E876 Hypokalemia: Secondary | ICD-10-CM | POA: Diagnosis not present

## 2018-04-11 DIAGNOSIS — G35 Multiple sclerosis: Secondary | ICD-10-CM | POA: Diagnosis not present

## 2018-04-11 DIAGNOSIS — G43909 Migraine, unspecified, not intractable, without status migrainosus: Secondary | ICD-10-CM | POA: Diagnosis not present

## 2018-04-13 ENCOUNTER — Ambulatory Visit
Admission: RE | Admit: 2018-04-13 | Discharge: 2018-04-13 | Disposition: A | Discharge status: Home / Self Care | Payer: Medicare HMO | Source: Ambulatory Visit | Attending: Vascular Surgery | Admitting: Vascular Surgery

## 2018-04-13 DIAGNOSIS — D735 Infarction of spleen: Secondary | ICD-10-CM

## 2018-04-13 DIAGNOSIS — J9811 Atelectasis: Secondary | ICD-10-CM | POA: Diagnosis not present

## 2018-04-13 DIAGNOSIS — I776 Arteritis, unspecified: Secondary | ICD-10-CM

## 2018-04-13 DIAGNOSIS — K7689 Other specified diseases of liver: Secondary | ICD-10-CM | POA: Diagnosis not present

## 2018-04-13 MED ORDER — IOPAMIDOL (ISOVUE-370) INJECTION 76%
75.0000 mL | Freq: Once | INTRAVENOUS | Status: AC | PRN
  Administered 2018-04-13: 75 mL via INTRAVENOUS

## 2018-04-14 ENCOUNTER — Ambulatory Visit: Payer: Medicare HMO | Admitting: Vascular Surgery

## 2018-04-14 ENCOUNTER — Encounter: Payer: Self-pay | Admitting: Vascular Surgery

## 2018-04-14 ENCOUNTER — Other Ambulatory Visit: Payer: Self-pay

## 2018-04-14 VITALS — BP 142/98 | HR 96 | Temp 99.0°F | Resp 20 | Ht 65.0 in | Wt 193.0 lb

## 2018-04-14 DIAGNOSIS — I776 Arteritis, unspecified: Secondary | ICD-10-CM | POA: Diagnosis not present

## 2018-04-14 NOTE — Progress Notes (Signed)
Patient ID: Amy Horton, female   DOB: 05/13/1971, 47 y.o.   MRN: [id number]  Reason for Consult: New Patient (Initial Visit) (CT Angio. Pain at top of abdomen. )   Referred by [facility]  Subjective:     HPI:  Amy Horton is a 47 y.o. 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  Her pain subsided she was subsequently discharged home.  She now has plans to be evaluated by rheumatology and she has been maintained on steroids.  She has not seen any weight change thankfully.  She is having some pain with eating as well as some left upper quadrant pain that persist.  She has not had any fevers.  She has CT scan performed yesterday.  Past Medical History:  Diagnosis Date  . Anxiety and depression   . Headache(784.0)   . Hypertension   . Multiple sclerosis [facility]) March 1997   Family History  Problem Relation Age of Onset  . Hypertension Other    Past Surgical History:  Procedure Laterality Date  . bone removed from left wrist     . BREAST BIOPSY     teenager  . CESAREAN SECTION  [id number]   x2  . CHOLECYSTECTOMY  2002  . stent in bile duct     . TYMPANOSTOMY TUBE PLACEMENT     x2 as a child    Short Social History:  Social History   Tobacco Use  . Smoking status: Never Smoker  . Smokeless tobacco: Never Used  Substance Use Topics  . Alcohol use: No    Allergies  Allergen Reactions  . Codeine     Current Outpatient Medications  Medication Sig Dispense Refill  . acyclovir (ZOVIRAX) 400 MG tablet Take 1 tablet by mouth as needed.    . ALPRAZolam (XANAX) 0.5 MG tablet Take 30 minutes prior to procedure 1 tablet 0  . aspirin EC 81 MG EC tablet Take 1 tablet (81 mg total) by mouth daily. 30 tablet 1  . buPROPion (WELLBUTRIN XL) 150 MG 24 hr tablet Take 1 tablet by mouth daily.  3  . losartan (COZAAR) 50 MG tablet Take 100 mg by mouth daily.     . predniSONE (DELTASONE) 10 MG tablet Take  3 tablets (30 mg total) by mouth 2 (two) times daily with a meal. (Patient taking differently: Take 30 mg by mouth 2 (two) times daily with a meal. Taper) 60 tablet 1  . sertraline (ZOLOFT) 50 MG tablet Take 50 mg by mouth 3 (three) times daily.  0  . topiramate (TOPAMAX) 25 MG tablet Take 2 tablets by mouth 2 (two) times daily.  5  . ferrous sulfate 325 (65 FE) MG EC tablet Take 1 tablet (325 mg total) by mouth daily with breakfast. 30 tablet 0  . oxyCODONE-acetaminophen (PERCOCET) 5-325 MG tablet Take 1 tablet by mouth every 8 (eight) hours as needed for severe pain. (Patient not taking: Reported on 04/14/2018) 20 tablet 0  . pantoprazole (PROTONIX) 40 MG tablet Take 1 tablet (40 mg total) by mouth daily. 30 tablet 0   No current facility-administered medications for this visit.     Review of Systems  Constitutional: Negative for chills, diaphoresis, fatigue and fever.  HENT: HENT negative.  Eyes: Eyes negative.  GI: Positive for abdominal pain.  Musculoskeletal: Musculoskeletal negative.  Skin: Skin negative.  Neurological: Neurological negative. Hematologic: Hematologic/lymphatic negative.  Psychiatric: Psychiatric negative.  Objective:  Objective   Vitals:   04/14/18 1554  BP: (!) 142/98  Pulse: 96  Resp: 20  Temp: 99 F (37.2 C)  TempSrc: Oral  SpO2: 97%  Weight: 193 lb (87.5 kg)  Height: 5\' 5"  (1.651 m)   Body mass index is 32.12 kg/m.  Physical Exam  Constitutional: She is oriented to person, place, and time. She appears well-developed.  HENT:  Head: Normocephalic.  Eyes: Pupils are equal, round, and reactive to light.  Neck: Normal range of motion. Neck supple.  Cardiovascular: Normal rate.  Pulses:      Carotid pulses are 2+ on the right side, and 2+ on the left side.      Radial pulses are 2+ on the right side, and 2+ on the left side.       Popliteal pulses are 2+ on the right side, and 2+ on the left side.  Abdominal: Soft. There is tenderness.    Musculoskeletal: She exhibits no edema.  Neurological: She is alert and oriented to person, place, and time.  Skin: Skin is dry.  Psychiatric: She has a normal mood and affect. Her behavior is normal. Judgment and thought content normal.    Data: Chest CTA Impression:  1. 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 No evidence of thoracic aortic dissection or periaortic stranding. Recommend annual imaging followup by CTA or MRA. This recommendation follows 2010 ACCF/AHA/AATS/ACR/ASA/SCA/SCAI/SIR/STS/SVM Guidelines for the Diagnosis and Management of Patients with Thoracic Aortic Disease. Circulation. 2010; 121: H417-E081  CTA of the abdomen and pelvis Impression:  1. 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 2. Beaded irregularity with extensive wall thickening involving the mid and distal aspects of the splenic artery. 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 3. 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 4. Persistent occlusion of the anterior division of the splenic artery with expected involution of known splenic infarct and interval atrophy of the anterior pole of the spleen.  I have independently reviewed the patient's CT scan with her present.  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  Hepatic artery generally appears better and she has had splenic infarcts with the noted interval atrophy of the spleen.       Assessment/Plan:     47 year old female with noted  irregularities of the celiac axis now with interval enlargement of her splenic artery stable splenic infarcts on CT scan.  In general her hepatic artery appears better with stable celiac axis changes.  She has no plans for pregnancy and is undergone surgical fixation to ensure no future pregnancies occur.  With this she should be at low risk for splenic artery rupture although the increasing size is certainly concerning with ongoing pain.  I would not recommend splenectomy at this time given the general inflammatory appearance of the artery.  She will remain on steroids we will follow-up with rheumatology and I will repeat a CT scan in 3 months at which time we can make a determination if she needs any intervention or if splenectomy with the best interest.  We  discussed the signs and symptoms of splenic artery rupture as well as thrombosis and she would need to be seen emergently for any of those.  I think this is low risk we will likely see her in 3 months with repeat scan.     Waynetta Sandy MD [facility]

## 2018-04-18 ENCOUNTER — Other Ambulatory Visit: Payer: Self-pay

## 2018-04-18 DIAGNOSIS — I712 Thoracic aortic aneurysm, without rupture, unspecified: Secondary | ICD-10-CM

## 2018-04-19 DIAGNOSIS — E876 Hypokalemia: Secondary | ICD-10-CM | POA: Diagnosis not present

## 2018-04-25 DIAGNOSIS — M3130 Wegener's granulomatosis without renal involvement: Secondary | ICD-10-CM | POA: Diagnosis not present

## 2018-04-25 DIAGNOSIS — E669 Obesity, unspecified: Secondary | ICD-10-CM | POA: Diagnosis not present

## 2018-04-25 DIAGNOSIS — R1013 Epigastric pain: Secondary | ICD-10-CM | POA: Diagnosis not present

## 2018-04-25 DIAGNOSIS — Z6833 Body mass index (BMI) 33.0-33.9, adult: Secondary | ICD-10-CM | POA: Diagnosis not present

## 2018-04-25 DIAGNOSIS — R5383 Other fatigue: Secondary | ICD-10-CM | POA: Diagnosis not present

## 2018-04-25 DIAGNOSIS — D735 Infarction of spleen: Secondary | ICD-10-CM | POA: Diagnosis not present

## 2018-04-25 DIAGNOSIS — G35 Multiple sclerosis: Secondary | ICD-10-CM | POA: Diagnosis not present

## 2018-05-19 DIAGNOSIS — K3189 Other diseases of stomach and duodenum: Secondary | ICD-10-CM | POA: Diagnosis not present

## 2018-05-19 DIAGNOSIS — E0965 Drug or chemical induced diabetes mellitus with hyperglycemia: Secondary | ICD-10-CM | POA: Diagnosis not present

## 2018-05-19 DIAGNOSIS — R634 Abnormal weight loss: Secondary | ICD-10-CM | POA: Diagnosis not present

## 2018-05-19 DIAGNOSIS — K228 Other specified diseases of esophagus: Secondary | ICD-10-CM | POA: Diagnosis not present

## 2018-05-19 DIAGNOSIS — I10 Essential (primary) hypertension: Secondary | ICD-10-CM | POA: Diagnosis not present

## 2018-05-19 DIAGNOSIS — K2970 Gastritis, unspecified, without bleeding: Secondary | ICD-10-CM | POA: Diagnosis not present

## 2018-05-19 DIAGNOSIS — R1084 Generalized abdominal pain: Secondary | ICD-10-CM | POA: Diagnosis not present

## 2018-05-19 DIAGNOSIS — I748 Embolism and thrombosis of other arteries: Secondary | ICD-10-CM | POA: Diagnosis not present

## 2018-05-19 DIAGNOSIS — I77810 Thoracic aortic ectasia: Secondary | ICD-10-CM | POA: Diagnosis not present

## 2018-05-19 DIAGNOSIS — I7779 Dissection of other artery: Secondary | ICD-10-CM | POA: Diagnosis not present

## 2018-05-19 DIAGNOSIS — I728 Aneurysm of other specified arteries: Secondary | ICD-10-CM | POA: Diagnosis not present

## 2018-05-19 DIAGNOSIS — F418 Other specified anxiety disorders: Secondary | ICD-10-CM | POA: Diagnosis not present

## 2018-05-19 DIAGNOSIS — E872 Acidosis: Secondary | ICD-10-CM | POA: Diagnosis not present

## 2018-05-19 DIAGNOSIS — I774 Celiac artery compression syndrome: Secondary | ICD-10-CM | POA: Diagnosis not present

## 2018-05-19 DIAGNOSIS — I776 Arteritis, unspecified: Secondary | ICD-10-CM | POA: Diagnosis not present

## 2018-05-19 DIAGNOSIS — I708 Atherosclerosis of other arteries: Secondary | ICD-10-CM | POA: Diagnosis not present

## 2018-05-19 DIAGNOSIS — D735 Infarction of spleen: Secondary | ICD-10-CM | POA: Diagnosis not present

## 2018-05-19 DIAGNOSIS — R1013 Epigastric pain: Secondary | ICD-10-CM | POA: Diagnosis not present

## 2018-05-19 DIAGNOSIS — M308 Other conditions related to polyarteritis nodosa: Secondary | ICD-10-CM | POA: Diagnosis not present

## 2018-05-19 DIAGNOSIS — G35 Multiple sclerosis: Secondary | ICD-10-CM | POA: Diagnosis not present

## 2018-05-19 DIAGNOSIS — M300 Polyarteritis nodosa: Secondary | ICD-10-CM | POA: Diagnosis not present

## 2018-05-19 DIAGNOSIS — I712 Thoracic aortic aneurysm, without rupture: Secondary | ICD-10-CM | POA: Diagnosis not present

## 2018-05-19 DIAGNOSIS — Z23 Encounter for immunization: Secondary | ICD-10-CM | POA: Diagnosis not present

## 2018-05-19 DIAGNOSIS — T380X5A Adverse effect of glucocorticoids and synthetic analogues, initial encounter: Secondary | ICD-10-CM | POA: Diagnosis not present

## 2018-05-20 DIAGNOSIS — Z23 Encounter for immunization: Secondary | ICD-10-CM | POA: Diagnosis not present

## 2018-05-23 DIAGNOSIS — Z23 Encounter for immunization: Secondary | ICD-10-CM | POA: Diagnosis not present

## 2018-05-26 DIAGNOSIS — Z23 Encounter for immunization: Secondary | ICD-10-CM | POA: Diagnosis not present

## 2018-06-01 DIAGNOSIS — Z23 Encounter for immunization: Secondary | ICD-10-CM | POA: Diagnosis not present

## 2018-06-08 DIAGNOSIS — E782 Mixed hyperlipidemia: Secondary | ICD-10-CM | POA: Diagnosis not present

## 2018-06-08 DIAGNOSIS — E876 Hypokalemia: Secondary | ICD-10-CM | POA: Diagnosis not present

## 2018-06-08 DIAGNOSIS — I10 Essential (primary) hypertension: Secondary | ICD-10-CM | POA: Diagnosis not present

## 2018-06-08 DIAGNOSIS — E1165 Type 2 diabetes mellitus with hyperglycemia: Secondary | ICD-10-CM | POA: Diagnosis not present

## 2018-06-12 DIAGNOSIS — M300 Polyarteritis nodosa: Secondary | ICD-10-CM | POA: Diagnosis not present

## 2018-06-16 DIAGNOSIS — Z79899 Other long term (current) drug therapy: Secondary | ICD-10-CM | POA: Diagnosis not present

## 2018-06-16 DIAGNOSIS — R1013 Epigastric pain: Secondary | ICD-10-CM | POA: Diagnosis not present

## 2018-06-16 DIAGNOSIS — I7779 Dissection of other artery: Secondary | ICD-10-CM | POA: Diagnosis not present

## 2018-06-16 DIAGNOSIS — Z23 Encounter for immunization: Secondary | ICD-10-CM | POA: Diagnosis not present

## 2018-06-16 DIAGNOSIS — M300 Polyarteritis nodosa: Secondary | ICD-10-CM | POA: Diagnosis not present

## 2018-06-16 DIAGNOSIS — I10 Essential (primary) hypertension: Secondary | ICD-10-CM | POA: Diagnosis not present

## 2018-06-16 DIAGNOSIS — I513 Intracardiac thrombosis, not elsewhere classified: Secondary | ICD-10-CM | POA: Diagnosis not present

## 2018-06-16 DIAGNOSIS — Z8659 Personal history of other mental and behavioral disorders: Secondary | ICD-10-CM | POA: Diagnosis not present

## 2018-06-16 DIAGNOSIS — E876 Hypokalemia: Secondary | ICD-10-CM | POA: Diagnosis not present

## 2018-06-16 DIAGNOSIS — G35 Multiple sclerosis: Secondary | ICD-10-CM | POA: Diagnosis not present

## 2018-06-16 DIAGNOSIS — F419 Anxiety disorder, unspecified: Secondary | ICD-10-CM | POA: Diagnosis not present

## 2018-06-16 DIAGNOSIS — F064 Anxiety disorder due to known physiological condition: Secondary | ICD-10-CM | POA: Diagnosis not present

## 2018-06-16 DIAGNOSIS — R1084 Generalized abdominal pain: Secondary | ICD-10-CM | POA: Diagnosis not present

## 2018-06-16 DIAGNOSIS — F329 Major depressive disorder, single episode, unspecified: Secondary | ICD-10-CM | POA: Diagnosis not present

## 2018-06-16 DIAGNOSIS — Z7952 Long term (current) use of systemic steroids: Secondary | ICD-10-CM | POA: Diagnosis not present

## 2018-06-16 DIAGNOSIS — I728 Aneurysm of other specified arteries: Secondary | ICD-10-CM | POA: Diagnosis not present

## 2018-06-16 DIAGNOSIS — D735 Infarction of spleen: Secondary | ICD-10-CM | POA: Diagnosis not present

## 2018-06-23 ENCOUNTER — Other Ambulatory Visit: Payer: Medicare HMO

## 2018-06-23 ENCOUNTER — Ambulatory Visit: Payer: Medicare HMO | Admitting: Vascular Surgery

## 2018-06-26 DIAGNOSIS — M300 Polyarteritis nodosa: Secondary | ICD-10-CM | POA: Diagnosis not present

## 2018-06-30 DIAGNOSIS — I10 Essential (primary) hypertension: Secondary | ICD-10-CM | POA: Diagnosis not present

## 2018-06-30 DIAGNOSIS — E876 Hypokalemia: Secondary | ICD-10-CM | POA: Diagnosis not present

## 2018-06-30 DIAGNOSIS — E1165 Type 2 diabetes mellitus with hyperglycemia: Secondary | ICD-10-CM | POA: Diagnosis not present

## 2018-06-30 DIAGNOSIS — E782 Mixed hyperlipidemia: Secondary | ICD-10-CM | POA: Diagnosis not present

## 2018-07-04 DIAGNOSIS — G35 Multiple sclerosis: Secondary | ICD-10-CM | POA: Diagnosis not present

## 2018-07-04 DIAGNOSIS — D735 Infarction of spleen: Secondary | ICD-10-CM | POA: Diagnosis not present

## 2018-07-04 DIAGNOSIS — M300 Polyarteritis nodosa: Secondary | ICD-10-CM | POA: Diagnosis not present

## 2018-07-04 DIAGNOSIS — Z6832 Body mass index (BMI) 32.0-32.9, adult: Secondary | ICD-10-CM | POA: Diagnosis not present

## 2018-07-04 DIAGNOSIS — E1165 Type 2 diabetes mellitus with hyperglycemia: Secondary | ICD-10-CM | POA: Diagnosis not present

## 2018-07-04 DIAGNOSIS — I7779 Dissection of other artery: Secondary | ICD-10-CM | POA: Diagnosis not present

## 2018-07-04 DIAGNOSIS — G43909 Migraine, unspecified, not intractable, without status migrainosus: Secondary | ICD-10-CM | POA: Diagnosis not present

## 2018-07-04 DIAGNOSIS — I774 Celiac artery compression syndrome: Secondary | ICD-10-CM | POA: Diagnosis not present

## 2018-07-04 DIAGNOSIS — I10 Essential (primary) hypertension: Secondary | ICD-10-CM | POA: Diagnosis not present

## 2018-07-04 DIAGNOSIS — E782 Mixed hyperlipidemia: Secondary | ICD-10-CM | POA: Diagnosis not present

## 2018-07-24 DIAGNOSIS — M300 Polyarteritis nodosa: Secondary | ICD-10-CM | POA: Diagnosis not present

## 2018-08-21 DIAGNOSIS — M300 Polyarteritis nodosa: Secondary | ICD-10-CM | POA: Diagnosis not present

## 2018-08-24 DIAGNOSIS — M300 Polyarteritis nodosa: Secondary | ICD-10-CM | POA: Diagnosis not present

## 2018-08-24 DIAGNOSIS — D735 Infarction of spleen: Secondary | ICD-10-CM | POA: Diagnosis not present

## 2018-08-24 DIAGNOSIS — I7779 Dissection of other artery: Secondary | ICD-10-CM | POA: Diagnosis not present

## 2018-08-24 DIAGNOSIS — I776 Arteritis, unspecified: Secondary | ICD-10-CM | POA: Diagnosis not present

## 2018-08-24 DIAGNOSIS — I723 Aneurysm of iliac artery: Secondary | ICD-10-CM | POA: Diagnosis not present

## 2018-08-30 DIAGNOSIS — M300 Polyarteritis nodosa: Secondary | ICD-10-CM | POA: Diagnosis not present

## 2018-08-30 DIAGNOSIS — Z79899 Other long term (current) drug therapy: Secondary | ICD-10-CM | POA: Diagnosis not present

## 2018-09-18 DIAGNOSIS — M300 Polyarteritis nodosa: Secondary | ICD-10-CM | POA: Diagnosis not present

## 2018-10-12 DIAGNOSIS — E782 Mixed hyperlipidemia: Secondary | ICD-10-CM | POA: Diagnosis not present

## 2018-10-12 DIAGNOSIS — I10 Essential (primary) hypertension: Secondary | ICD-10-CM | POA: Diagnosis not present

## 2018-10-12 DIAGNOSIS — E1165 Type 2 diabetes mellitus with hyperglycemia: Secondary | ICD-10-CM | POA: Diagnosis not present

## 2018-10-12 DIAGNOSIS — E876 Hypokalemia: Secondary | ICD-10-CM | POA: Diagnosis not present

## 2018-10-16 DIAGNOSIS — M300 Polyarteritis nodosa: Secondary | ICD-10-CM | POA: Diagnosis not present

## 2018-10-17 DIAGNOSIS — G43909 Migraine, unspecified, not intractable, without status migrainosus: Secondary | ICD-10-CM | POA: Diagnosis not present

## 2018-10-17 DIAGNOSIS — G35 Multiple sclerosis: Secondary | ICD-10-CM | POA: Diagnosis not present

## 2018-10-17 DIAGNOSIS — Z6833 Body mass index (BMI) 33.0-33.9, adult: Secondary | ICD-10-CM | POA: Diagnosis not present

## 2018-10-17 DIAGNOSIS — E782 Mixed hyperlipidemia: Secondary | ICD-10-CM | POA: Diagnosis not present

## 2018-10-17 DIAGNOSIS — M300 Polyarteritis nodosa: Secondary | ICD-10-CM | POA: Diagnosis not present

## 2018-10-17 DIAGNOSIS — I10 Essential (primary) hypertension: Secondary | ICD-10-CM | POA: Diagnosis not present

## 2018-10-17 DIAGNOSIS — E1165 Type 2 diabetes mellitus with hyperglycemia: Secondary | ICD-10-CM | POA: Diagnosis not present

## 2018-10-17 DIAGNOSIS — D735 Infarction of spleen: Secondary | ICD-10-CM | POA: Diagnosis not present

## 2018-10-18 DIAGNOSIS — Z79899 Other long term (current) drug therapy: Secondary | ICD-10-CM | POA: Diagnosis not present

## 2018-10-18 DIAGNOSIS — M300 Polyarteritis nodosa: Secondary | ICD-10-CM | POA: Diagnosis not present

## 2018-11-29 DIAGNOSIS — G35 Multiple sclerosis: Secondary | ICD-10-CM | POA: Diagnosis not present

## 2018-12-13 DIAGNOSIS — G35 Multiple sclerosis: Secondary | ICD-10-CM | POA: Diagnosis not present

## 2018-12-13 DIAGNOSIS — L259 Unspecified contact dermatitis, unspecified cause: Secondary | ICD-10-CM | POA: Diagnosis not present

## 2018-12-13 DIAGNOSIS — Z6832 Body mass index (BMI) 32.0-32.9, adult: Secondary | ICD-10-CM | POA: Diagnosis not present

## 2018-12-20 DIAGNOSIS — M300 Polyarteritis nodosa: Secondary | ICD-10-CM | POA: Diagnosis not present

## 2018-12-20 DIAGNOSIS — Z6832 Body mass index (BMI) 32.0-32.9, adult: Secondary | ICD-10-CM | POA: Diagnosis not present

## 2018-12-20 DIAGNOSIS — L259 Unspecified contact dermatitis, unspecified cause: Secondary | ICD-10-CM | POA: Diagnosis not present

## 2019-01-15 DIAGNOSIS — Z79899 Other long term (current) drug therapy: Secondary | ICD-10-CM | POA: Diagnosis not present

## 2019-01-15 DIAGNOSIS — E119 Type 2 diabetes mellitus without complications: Secondary | ICD-10-CM | POA: Diagnosis not present

## 2019-01-15 DIAGNOSIS — E785 Hyperlipidemia, unspecified: Secondary | ICD-10-CM | POA: Diagnosis not present

## 2019-01-16 DIAGNOSIS — G35 Multiple sclerosis: Secondary | ICD-10-CM | POA: Diagnosis not present

## 2019-01-23 DIAGNOSIS — E1165 Type 2 diabetes mellitus with hyperglycemia: Secondary | ICD-10-CM | POA: Diagnosis not present

## 2019-01-23 DIAGNOSIS — Z1389 Encounter for screening for other disorder: Secondary | ICD-10-CM | POA: Diagnosis not present

## 2019-01-23 DIAGNOSIS — G35 Multiple sclerosis: Secondary | ICD-10-CM | POA: Diagnosis not present

## 2019-01-23 DIAGNOSIS — G43909 Migraine, unspecified, not intractable, without status migrainosus: Secondary | ICD-10-CM | POA: Diagnosis not present

## 2019-01-23 DIAGNOSIS — M300 Polyarteritis nodosa: Secondary | ICD-10-CM | POA: Diagnosis not present

## 2019-01-23 DIAGNOSIS — F411 Generalized anxiety disorder: Secondary | ICD-10-CM | POA: Diagnosis not present

## 2019-01-23 DIAGNOSIS — D735 Infarction of spleen: Secondary | ICD-10-CM | POA: Diagnosis not present

## 2019-01-23 DIAGNOSIS — I10 Essential (primary) hypertension: Secondary | ICD-10-CM | POA: Diagnosis not present

## 2019-02-13 DIAGNOSIS — F331 Major depressive disorder, recurrent, moderate: Secondary | ICD-10-CM | POA: Diagnosis not present

## 2019-02-13 DIAGNOSIS — E782 Mixed hyperlipidemia: Secondary | ICD-10-CM | POA: Diagnosis not present

## 2019-03-16 DIAGNOSIS — I10 Essential (primary) hypertension: Secondary | ICD-10-CM | POA: Diagnosis not present

## 2019-03-16 DIAGNOSIS — E7800 Pure hypercholesterolemia, unspecified: Secondary | ICD-10-CM | POA: Diagnosis not present

## 2019-04-04 DIAGNOSIS — I771 Stricture of artery: Secondary | ICD-10-CM | POA: Diagnosis not present

## 2019-04-04 DIAGNOSIS — I723 Aneurysm of iliac artery: Secondary | ICD-10-CM | POA: Diagnosis not present

## 2019-04-04 DIAGNOSIS — M300 Polyarteritis nodosa: Secondary | ICD-10-CM | POA: Diagnosis not present

## 2019-04-04 DIAGNOSIS — I7779 Dissection of other artery: Secondary | ICD-10-CM | POA: Diagnosis not present

## 2019-04-04 DIAGNOSIS — I774 Celiac artery compression syndrome: Secondary | ICD-10-CM | POA: Diagnosis not present

## 2019-04-04 DIAGNOSIS — I159 Secondary hypertension, unspecified: Secondary | ICD-10-CM | POA: Diagnosis not present

## 2019-04-04 DIAGNOSIS — Z79899 Other long term (current) drug therapy: Secondary | ICD-10-CM | POA: Diagnosis not present

## 2019-04-09 DIAGNOSIS — Z6832 Body mass index (BMI) 32.0-32.9, adult: Secondary | ICD-10-CM | POA: Diagnosis not present

## 2019-04-09 DIAGNOSIS — R300 Dysuria: Secondary | ICD-10-CM | POA: Diagnosis not present

## 2019-04-12 DIAGNOSIS — R1084 Generalized abdominal pain: Secondary | ICD-10-CM | POA: Diagnosis not present

## 2019-04-12 DIAGNOSIS — M300 Polyarteritis nodosa: Secondary | ICD-10-CM | POA: Diagnosis not present

## 2019-04-26 DIAGNOSIS — G35 Multiple sclerosis: Secondary | ICD-10-CM | POA: Diagnosis not present

## 2019-04-26 DIAGNOSIS — I10 Essential (primary) hypertension: Secondary | ICD-10-CM | POA: Diagnosis not present

## 2019-04-26 DIAGNOSIS — E782 Mixed hyperlipidemia: Secondary | ICD-10-CM | POA: Diagnosis not present

## 2019-04-26 DIAGNOSIS — F411 Generalized anxiety disorder: Secondary | ICD-10-CM | POA: Diagnosis not present

## 2019-04-26 DIAGNOSIS — D735 Infarction of spleen: Secondary | ICD-10-CM | POA: Diagnosis not present

## 2019-04-26 DIAGNOSIS — F329 Major depressive disorder, single episode, unspecified: Secondary | ICD-10-CM | POA: Diagnosis not present

## 2019-04-26 DIAGNOSIS — G43909 Migraine, unspecified, not intractable, without status migrainosus: Secondary | ICD-10-CM | POA: Diagnosis not present

## 2019-04-26 DIAGNOSIS — E1165 Type 2 diabetes mellitus with hyperglycemia: Secondary | ICD-10-CM | POA: Diagnosis not present

## 2019-04-26 DIAGNOSIS — M300 Polyarteritis nodosa: Secondary | ICD-10-CM | POA: Diagnosis not present

## 2019-05-02 DIAGNOSIS — Z01419 Encounter for gynecological examination (general) (routine) without abnormal findings: Secondary | ICD-10-CM | POA: Diagnosis not present

## 2019-05-15 DIAGNOSIS — R1013 Epigastric pain: Secondary | ICD-10-CM | POA: Diagnosis not present

## 2019-05-15 DIAGNOSIS — R1084 Generalized abdominal pain: Secondary | ICD-10-CM | POA: Diagnosis not present

## 2019-05-15 DIAGNOSIS — M300 Polyarteritis nodosa: Secondary | ICD-10-CM | POA: Diagnosis not present

## 2019-06-13 DIAGNOSIS — E782 Mixed hyperlipidemia: Secondary | ICD-10-CM | POA: Diagnosis not present

## 2019-06-13 DIAGNOSIS — R7309 Other abnormal glucose: Secondary | ICD-10-CM | POA: Diagnosis not present

## 2019-06-13 DIAGNOSIS — G35 Multiple sclerosis: Secondary | ICD-10-CM | POA: Diagnosis not present

## 2019-06-13 DIAGNOSIS — E876 Hypokalemia: Secondary | ICD-10-CM | POA: Diagnosis not present

## 2019-06-16 DIAGNOSIS — Z20828 Contact with and (suspected) exposure to other viral communicable diseases: Secondary | ICD-10-CM | POA: Diagnosis not present

## 2019-06-16 DIAGNOSIS — Z01818 Encounter for other preprocedural examination: Secondary | ICD-10-CM | POA: Diagnosis not present

## 2019-06-18 DIAGNOSIS — E669 Obesity, unspecified: Secondary | ICD-10-CM | POA: Diagnosis not present

## 2019-06-18 DIAGNOSIS — I10 Essential (primary) hypertension: Secondary | ICD-10-CM | POA: Diagnosis not present

## 2019-06-18 DIAGNOSIS — Z6834 Body mass index (BMI) 34.0-34.9, adult: Secondary | ICD-10-CM | POA: Diagnosis not present

## 2019-06-18 DIAGNOSIS — F329 Major depressive disorder, single episode, unspecified: Secondary | ICD-10-CM | POA: Diagnosis not present

## 2019-06-18 DIAGNOSIS — N179 Acute kidney failure, unspecified: Secondary | ICD-10-CM | POA: Diagnosis not present

## 2019-06-18 DIAGNOSIS — M300 Polyarteritis nodosa: Secondary | ICD-10-CM | POA: Diagnosis not present

## 2019-06-18 DIAGNOSIS — K219 Gastro-esophageal reflux disease without esophagitis: Secondary | ICD-10-CM | POA: Diagnosis not present

## 2019-06-18 DIAGNOSIS — G35 Multiple sclerosis: Secondary | ICD-10-CM | POA: Diagnosis not present

## 2019-06-18 DIAGNOSIS — I728 Aneurysm of other specified arteries: Secondary | ICD-10-CM | POA: Diagnosis not present

## 2019-06-18 DIAGNOSIS — F419 Anxiety disorder, unspecified: Secondary | ICD-10-CM | POA: Diagnosis not present

## 2019-07-16 DIAGNOSIS — E1165 Type 2 diabetes mellitus with hyperglycemia: Secondary | ICD-10-CM | POA: Diagnosis not present

## 2019-07-16 DIAGNOSIS — E782 Mixed hyperlipidemia: Secondary | ICD-10-CM | POA: Diagnosis not present

## 2019-07-16 DIAGNOSIS — E876 Hypokalemia: Secondary | ICD-10-CM | POA: Diagnosis not present

## 2019-07-16 DIAGNOSIS — I10 Essential (primary) hypertension: Secondary | ICD-10-CM | POA: Diagnosis not present

## 2019-07-17 DIAGNOSIS — I728 Aneurysm of other specified arteries: Secondary | ICD-10-CM | POA: Diagnosis not present

## 2019-07-23 DIAGNOSIS — G43909 Migraine, unspecified, not intractable, without status migrainosus: Secondary | ICD-10-CM | POA: Diagnosis not present

## 2019-07-23 DIAGNOSIS — I10 Essential (primary) hypertension: Secondary | ICD-10-CM | POA: Diagnosis not present

## 2019-07-23 DIAGNOSIS — Z6832 Body mass index (BMI) 32.0-32.9, adult: Secondary | ICD-10-CM | POA: Diagnosis not present

## 2019-07-23 DIAGNOSIS — M300 Polyarteritis nodosa: Secondary | ICD-10-CM | POA: Diagnosis not present

## 2019-07-23 DIAGNOSIS — E559 Vitamin D deficiency, unspecified: Secondary | ICD-10-CM | POA: Diagnosis not present

## 2019-07-23 DIAGNOSIS — E1165 Type 2 diabetes mellitus with hyperglycemia: Secondary | ICD-10-CM | POA: Diagnosis not present

## 2019-07-23 DIAGNOSIS — D735 Infarction of spleen: Secondary | ICD-10-CM | POA: Diagnosis not present

## 2019-07-23 DIAGNOSIS — E782 Mixed hyperlipidemia: Secondary | ICD-10-CM | POA: Diagnosis not present

## 2019-07-25 DIAGNOSIS — M300 Polyarteritis nodosa: Secondary | ICD-10-CM | POA: Diagnosis not present

## 2019-07-25 DIAGNOSIS — Z79899 Other long term (current) drug therapy: Secondary | ICD-10-CM | POA: Diagnosis not present

## 2019-08-28 DIAGNOSIS — G35 Multiple sclerosis: Secondary | ICD-10-CM | POA: Diagnosis not present

## 2019-08-28 DIAGNOSIS — U071 COVID-19: Secondary | ICD-10-CM | POA: Diagnosis not present

## 2019-08-28 DIAGNOSIS — D849 Immunodeficiency, unspecified: Secondary | ICD-10-CM | POA: Diagnosis not present

## 2019-09-05 DIAGNOSIS — U071 COVID-19: Secondary | ICD-10-CM | POA: Diagnosis not present

## 2019-09-17 DIAGNOSIS — H8110 Benign paroxysmal vertigo, unspecified ear: Secondary | ICD-10-CM | POA: Diagnosis not present

## 2019-10-25 DIAGNOSIS — E1165 Type 2 diabetes mellitus with hyperglycemia: Secondary | ICD-10-CM | POA: Diagnosis not present

## 2019-10-25 DIAGNOSIS — E559 Vitamin D deficiency, unspecified: Secondary | ICD-10-CM | POA: Diagnosis not present

## 2019-10-25 DIAGNOSIS — M300 Polyarteritis nodosa: Secondary | ICD-10-CM | POA: Diagnosis not present

## 2019-10-25 DIAGNOSIS — I10 Essential (primary) hypertension: Secondary | ICD-10-CM | POA: Diagnosis not present

## 2019-10-25 DIAGNOSIS — Z6833 Body mass index (BMI) 33.0-33.9, adult: Secondary | ICD-10-CM | POA: Diagnosis not present

## 2019-10-25 DIAGNOSIS — G43909 Migraine, unspecified, not intractable, without status migrainosus: Secondary | ICD-10-CM | POA: Diagnosis not present

## 2019-10-25 DIAGNOSIS — G35 Multiple sclerosis: Secondary | ICD-10-CM | POA: Diagnosis not present

## 2019-10-25 DIAGNOSIS — D735 Infarction of spleen: Secondary | ICD-10-CM | POA: Diagnosis not present

## 2020-01-29 DIAGNOSIS — M300 Polyarteritis nodosa: Secondary | ICD-10-CM | POA: Diagnosis not present

## 2020-01-29 DIAGNOSIS — G35 Multiple sclerosis: Secondary | ICD-10-CM | POA: Diagnosis not present

## 2020-01-29 DIAGNOSIS — E782 Mixed hyperlipidemia: Secondary | ICD-10-CM | POA: Diagnosis not present

## 2020-01-29 DIAGNOSIS — I10 Essential (primary) hypertension: Secondary | ICD-10-CM | POA: Diagnosis not present

## 2020-01-29 DIAGNOSIS — F411 Generalized anxiety disorder: Secondary | ICD-10-CM | POA: Diagnosis not present

## 2020-01-29 DIAGNOSIS — E1165 Type 2 diabetes mellitus with hyperglycemia: Secondary | ICD-10-CM | POA: Diagnosis not present

## 2020-01-29 DIAGNOSIS — Z1331 Encounter for screening for depression: Secondary | ICD-10-CM | POA: Diagnosis not present

## 2020-01-29 DIAGNOSIS — Z1389 Encounter for screening for other disorder: Secondary | ICD-10-CM | POA: Diagnosis not present

## 2020-01-29 DIAGNOSIS — G43909 Migraine, unspecified, not intractable, without status migrainosus: Secondary | ICD-10-CM | POA: Diagnosis not present

## 2020-01-29 DIAGNOSIS — D735 Infarction of spleen: Secondary | ICD-10-CM | POA: Diagnosis not present

## 2020-01-29 DIAGNOSIS — F329 Major depressive disorder, single episode, unspecified: Secondary | ICD-10-CM | POA: Diagnosis not present

## 2020-01-29 DIAGNOSIS — Z0001 Encounter for general adult medical examination with abnormal findings: Secondary | ICD-10-CM | POA: Diagnosis not present

## 2020-04-08 DIAGNOSIS — Z1231 Encounter for screening mammogram for malignant neoplasm of breast: Secondary | ICD-10-CM | POA: Diagnosis not present

## 2020-04-16 DIAGNOSIS — R928 Other abnormal and inconclusive findings on diagnostic imaging of breast: Secondary | ICD-10-CM | POA: Diagnosis not present

## 2020-04-16 DIAGNOSIS — N6321 Unspecified lump in the left breast, upper outer quadrant: Secondary | ICD-10-CM | POA: Diagnosis not present

## 2020-04-23 DIAGNOSIS — D242 Benign neoplasm of left breast: Secondary | ICD-10-CM | POA: Diagnosis not present

## 2020-04-23 DIAGNOSIS — N6321 Unspecified lump in the left breast, upper outer quadrant: Secondary | ICD-10-CM | POA: Diagnosis not present

## 2020-05-01 DIAGNOSIS — Z79899 Other long term (current) drug therapy: Secondary | ICD-10-CM | POA: Diagnosis not present

## 2020-05-01 DIAGNOSIS — M300 Polyarteritis nodosa: Secondary | ICD-10-CM | POA: Diagnosis not present

## 2020-05-07 DIAGNOSIS — E1165 Type 2 diabetes mellitus with hyperglycemia: Secondary | ICD-10-CM | POA: Diagnosis not present

## 2020-05-07 DIAGNOSIS — I10 Essential (primary) hypertension: Secondary | ICD-10-CM | POA: Diagnosis not present

## 2020-05-07 DIAGNOSIS — E876 Hypokalemia: Secondary | ICD-10-CM | POA: Diagnosis not present

## 2020-05-07 DIAGNOSIS — E782 Mixed hyperlipidemia: Secondary | ICD-10-CM | POA: Diagnosis not present

## 2020-05-07 DIAGNOSIS — E559 Vitamin D deficiency, unspecified: Secondary | ICD-10-CM | POA: Diagnosis not present

## 2020-05-13 DIAGNOSIS — Z6832 Body mass index (BMI) 32.0-32.9, adult: Secondary | ICD-10-CM | POA: Diagnosis not present

## 2020-05-13 DIAGNOSIS — F411 Generalized anxiety disorder: Secondary | ICD-10-CM | POA: Diagnosis not present

## 2020-05-13 DIAGNOSIS — E782 Mixed hyperlipidemia: Secondary | ICD-10-CM | POA: Diagnosis not present

## 2020-05-13 DIAGNOSIS — E559 Vitamin D deficiency, unspecified: Secondary | ICD-10-CM | POA: Diagnosis not present

## 2020-05-13 DIAGNOSIS — M300 Polyarteritis nodosa: Secondary | ICD-10-CM | POA: Diagnosis not present

## 2020-05-13 DIAGNOSIS — E1165 Type 2 diabetes mellitus with hyperglycemia: Secondary | ICD-10-CM | POA: Diagnosis not present

## 2020-05-13 DIAGNOSIS — D735 Infarction of spleen: Secondary | ICD-10-CM | POA: Diagnosis not present

## 2020-05-13 DIAGNOSIS — I10 Essential (primary) hypertension: Secondary | ICD-10-CM | POA: Diagnosis not present

## 2020-05-15 DIAGNOSIS — Z20828 Contact with and (suspected) exposure to other viral communicable diseases: Secondary | ICD-10-CM | POA: Diagnosis not present

## 2020-06-10 DIAGNOSIS — I7772 Dissection of iliac artery: Secondary | ICD-10-CM | POA: Diagnosis not present

## 2020-06-10 DIAGNOSIS — I7779 Dissection of other artery: Secondary | ICD-10-CM | POA: Diagnosis not present

## 2020-06-10 DIAGNOSIS — I728 Aneurysm of other specified arteries: Secondary | ICD-10-CM | POA: Diagnosis not present

## 2020-06-10 DIAGNOSIS — I7789 Other specified disorders of arteries and arterioles: Secondary | ICD-10-CM | POA: Diagnosis not present

## 2020-06-10 DIAGNOSIS — D735 Infarction of spleen: Secondary | ICD-10-CM | POA: Diagnosis not present

## 2020-06-10 DIAGNOSIS — I723 Aneurysm of iliac artery: Secondary | ICD-10-CM | POA: Diagnosis not present

## 2020-06-16 DIAGNOSIS — G35 Multiple sclerosis: Secondary | ICD-10-CM | POA: Diagnosis not present

## 2020-06-16 DIAGNOSIS — F40240 Claustrophobia: Secondary | ICD-10-CM | POA: Diagnosis not present

## 2020-06-16 DIAGNOSIS — R2681 Unsteadiness on feet: Secondary | ICD-10-CM | POA: Diagnosis not present

## 2020-06-16 DIAGNOSIS — Z23 Encounter for immunization: Secondary | ICD-10-CM | POA: Diagnosis not present

## 2020-06-26 DIAGNOSIS — I10 Essential (primary) hypertension: Secondary | ICD-10-CM | POA: Diagnosis not present

## 2020-06-26 DIAGNOSIS — G35 Multiple sclerosis: Secondary | ICD-10-CM | POA: Diagnosis not present

## 2020-06-26 DIAGNOSIS — K130 Diseases of lips: Secondary | ICD-10-CM | POA: Diagnosis not present

## 2020-06-26 DIAGNOSIS — E1165 Type 2 diabetes mellitus with hyperglycemia: Secondary | ICD-10-CM | POA: Diagnosis not present

## 2020-06-26 DIAGNOSIS — D735 Infarction of spleen: Secondary | ICD-10-CM | POA: Diagnosis not present

## 2020-06-26 DIAGNOSIS — G43909 Migraine, unspecified, not intractable, without status migrainosus: Secondary | ICD-10-CM | POA: Diagnosis not present

## 2020-06-26 DIAGNOSIS — Z6832 Body mass index (BMI) 32.0-32.9, adult: Secondary | ICD-10-CM | POA: Diagnosis not present

## 2020-06-26 DIAGNOSIS — E559 Vitamin D deficiency, unspecified: Secondary | ICD-10-CM | POA: Diagnosis not present

## 2020-07-07 DIAGNOSIS — M4802 Spinal stenosis, cervical region: Secondary | ICD-10-CM | POA: Diagnosis not present

## 2020-07-07 DIAGNOSIS — G35 Multiple sclerosis: Secondary | ICD-10-CM | POA: Diagnosis not present

## 2020-07-07 DIAGNOSIS — R2681 Unsteadiness on feet: Secondary | ICD-10-CM | POA: Diagnosis not present

## 2020-07-07 DIAGNOSIS — M50222 Other cervical disc displacement at C5-C6 level: Secondary | ICD-10-CM | POA: Diagnosis not present

## 2020-07-07 DIAGNOSIS — F40240 Claustrophobia: Secondary | ICD-10-CM | POA: Diagnosis not present

## 2020-07-07 DIAGNOSIS — Z23 Encounter for immunization: Secondary | ICD-10-CM | POA: Diagnosis not present

## 2020-07-20 DIAGNOSIS — M25562 Pain in left knee: Secondary | ICD-10-CM | POA: Diagnosis not present

## 2020-07-22 DIAGNOSIS — I739 Peripheral vascular disease, unspecified: Secondary | ICD-10-CM | POA: Diagnosis not present

## 2020-07-22 DIAGNOSIS — D735 Infarction of spleen: Secondary | ICD-10-CM | POA: Diagnosis not present

## 2020-07-22 DIAGNOSIS — G35 Multiple sclerosis: Secondary | ICD-10-CM | POA: Diagnosis not present

## 2020-07-22 DIAGNOSIS — I723 Aneurysm of iliac artery: Secondary | ICD-10-CM | POA: Diagnosis not present

## 2020-07-22 DIAGNOSIS — M300 Polyarteritis nodosa: Secondary | ICD-10-CM | POA: Diagnosis not present

## 2020-07-22 DIAGNOSIS — I7779 Dissection of other artery: Secondary | ICD-10-CM | POA: Diagnosis not present

## 2020-08-05 DIAGNOSIS — R5383 Other fatigue: Secondary | ICD-10-CM | POA: Diagnosis not present

## 2020-08-05 DIAGNOSIS — E876 Hypokalemia: Secondary | ICD-10-CM | POA: Diagnosis not present

## 2020-08-05 DIAGNOSIS — I10 Essential (primary) hypertension: Secondary | ICD-10-CM | POA: Diagnosis not present

## 2020-08-05 DIAGNOSIS — E782 Mixed hyperlipidemia: Secondary | ICD-10-CM | POA: Diagnosis not present

## 2020-08-05 DIAGNOSIS — E1165 Type 2 diabetes mellitus with hyperglycemia: Secondary | ICD-10-CM | POA: Diagnosis not present

## 2020-08-12 DIAGNOSIS — E1165 Type 2 diabetes mellitus with hyperglycemia: Secondary | ICD-10-CM | POA: Diagnosis not present

## 2020-08-12 DIAGNOSIS — F411 Generalized anxiety disorder: Secondary | ICD-10-CM | POA: Diagnosis not present

## 2020-08-12 DIAGNOSIS — G43909 Migraine, unspecified, not intractable, without status migrainosus: Secondary | ICD-10-CM | POA: Diagnosis not present

## 2020-08-12 DIAGNOSIS — Z6833 Body mass index (BMI) 33.0-33.9, adult: Secondary | ICD-10-CM | POA: Diagnosis not present

## 2020-08-12 DIAGNOSIS — M300 Polyarteritis nodosa: Secondary | ICD-10-CM | POA: Diagnosis not present

## 2020-08-12 DIAGNOSIS — I10 Essential (primary) hypertension: Secondary | ICD-10-CM | POA: Diagnosis not present

## 2020-08-12 DIAGNOSIS — G35 Multiple sclerosis: Secondary | ICD-10-CM | POA: Diagnosis not present

## 2020-08-12 DIAGNOSIS — E782 Mixed hyperlipidemia: Secondary | ICD-10-CM | POA: Diagnosis not present

## 2020-08-12 DIAGNOSIS — F329 Major depressive disorder, single episode, unspecified: Secondary | ICD-10-CM | POA: Diagnosis not present

## 2020-08-12 DIAGNOSIS — D735 Infarction of spleen: Secondary | ICD-10-CM | POA: Diagnosis not present

## 2020-08-20 DIAGNOSIS — I7772 Dissection of iliac artery: Secondary | ICD-10-CM | POA: Diagnosis not present

## 2020-08-20 DIAGNOSIS — Z79899 Other long term (current) drug therapy: Secondary | ICD-10-CM | POA: Diagnosis not present

## 2020-08-20 DIAGNOSIS — I728 Aneurysm of other specified arteries: Secondary | ICD-10-CM | POA: Diagnosis not present

## 2020-08-20 DIAGNOSIS — Z23 Encounter for immunization: Secondary | ICD-10-CM | POA: Diagnosis not present

## 2020-08-20 DIAGNOSIS — M300 Polyarteritis nodosa: Secondary | ICD-10-CM | POA: Diagnosis not present

## 2020-08-28 DIAGNOSIS — G35 Multiple sclerosis: Secondary | ICD-10-CM | POA: Diagnosis not present

## 2020-08-29 DIAGNOSIS — R2681 Unsteadiness on feet: Secondary | ICD-10-CM | POA: Diagnosis not present

## 2020-08-29 DIAGNOSIS — G35 Multiple sclerosis: Secondary | ICD-10-CM | POA: Diagnosis not present

## 2020-09-13 DIAGNOSIS — R059 Cough, unspecified: Secondary | ICD-10-CM | POA: Diagnosis not present

## 2020-09-13 DIAGNOSIS — Z20828 Contact with and (suspected) exposure to other viral communicable diseases: Secondary | ICD-10-CM | POA: Diagnosis not present

## 2020-10-06 DIAGNOSIS — Z6834 Body mass index (BMI) 34.0-34.9, adult: Secondary | ICD-10-CM | POA: Diagnosis not present

## 2020-10-06 DIAGNOSIS — M300 Polyarteritis nodosa: Secondary | ICD-10-CM | POA: Diagnosis not present

## 2020-10-07 DIAGNOSIS — R590 Localized enlarged lymph nodes: Secondary | ICD-10-CM | POA: Diagnosis not present

## 2020-10-07 DIAGNOSIS — M25473 Effusion, unspecified ankle: Secondary | ICD-10-CM | POA: Diagnosis not present

## 2020-10-07 DIAGNOSIS — I723 Aneurysm of iliac artery: Secondary | ICD-10-CM | POA: Diagnosis not present

## 2020-10-07 DIAGNOSIS — M300 Polyarteritis nodosa: Secondary | ICD-10-CM | POA: Diagnosis not present

## 2020-10-07 DIAGNOSIS — B9620 Unspecified Escherichia coli [E. coli] as the cause of diseases classified elsewhere: Secondary | ICD-10-CM | POA: Diagnosis not present

## 2020-10-07 DIAGNOSIS — R9431 Abnormal electrocardiogram [ECG] [EKG]: Secondary | ICD-10-CM | POA: Diagnosis not present

## 2020-10-07 DIAGNOSIS — M25571 Pain in right ankle and joints of right foot: Secondary | ICD-10-CM | POA: Diagnosis not present

## 2020-10-07 DIAGNOSIS — M25532 Pain in left wrist: Secondary | ICD-10-CM | POA: Diagnosis not present

## 2020-10-07 DIAGNOSIS — R1012 Left upper quadrant pain: Secondary | ICD-10-CM | POA: Diagnosis not present

## 2020-10-07 DIAGNOSIS — I10 Essential (primary) hypertension: Secondary | ICD-10-CM | POA: Diagnosis not present

## 2020-10-07 DIAGNOSIS — M25531 Pain in right wrist: Secondary | ICD-10-CM | POA: Diagnosis not present

## 2020-10-07 DIAGNOSIS — Z20822 Contact with and (suspected) exposure to covid-19: Secondary | ICD-10-CM | POA: Diagnosis not present

## 2020-10-07 DIAGNOSIS — M25572 Pain in left ankle and joints of left foot: Secondary | ICD-10-CM | POA: Diagnosis not present

## 2020-10-07 DIAGNOSIS — R109 Unspecified abdominal pain: Secondary | ICD-10-CM | POA: Diagnosis not present

## 2020-10-07 DIAGNOSIS — I517 Cardiomegaly: Secondary | ICD-10-CM | POA: Diagnosis not present

## 2020-10-07 DIAGNOSIS — R918 Other nonspecific abnormal finding of lung field: Secondary | ICD-10-CM | POA: Diagnosis not present

## 2020-10-07 DIAGNOSIS — D849 Immunodeficiency, unspecified: Secondary | ICD-10-CM | POA: Diagnosis not present

## 2020-10-07 DIAGNOSIS — N390 Urinary tract infection, site not specified: Secondary | ICD-10-CM | POA: Diagnosis not present

## 2020-10-07 DIAGNOSIS — D735 Infarction of spleen: Secondary | ICD-10-CM | POA: Diagnosis not present

## 2020-10-08 DIAGNOSIS — M300 Polyarteritis nodosa: Secondary | ICD-10-CM | POA: Diagnosis not present

## 2020-10-08 DIAGNOSIS — R1012 Left upper quadrant pain: Secondary | ICD-10-CM | POA: Diagnosis not present

## 2020-10-08 DIAGNOSIS — R109 Unspecified abdominal pain: Secondary | ICD-10-CM | POA: Diagnosis not present

## 2020-10-08 DIAGNOSIS — I517 Cardiomegaly: Secondary | ICD-10-CM | POA: Diagnosis not present

## 2020-10-08 DIAGNOSIS — R918 Other nonspecific abnormal finding of lung field: Secondary | ICD-10-CM | POA: Diagnosis not present

## 2020-11-03 DIAGNOSIS — E7849 Other hyperlipidemia: Secondary | ICD-10-CM | POA: Diagnosis not present

## 2020-11-03 DIAGNOSIS — I10 Essential (primary) hypertension: Secondary | ICD-10-CM | POA: Diagnosis not present

## 2020-11-03 DIAGNOSIS — E782 Mixed hyperlipidemia: Secondary | ICD-10-CM | POA: Diagnosis not present

## 2020-11-03 DIAGNOSIS — E1165 Type 2 diabetes mellitus with hyperglycemia: Secondary | ICD-10-CM | POA: Diagnosis not present

## 2020-11-03 DIAGNOSIS — E876 Hypokalemia: Secondary | ICD-10-CM | POA: Diagnosis not present

## 2020-11-06 DIAGNOSIS — E782 Mixed hyperlipidemia: Secondary | ICD-10-CM | POA: Diagnosis not present

## 2020-11-06 DIAGNOSIS — R300 Dysuria: Secondary | ICD-10-CM | POA: Diagnosis not present

## 2020-11-06 DIAGNOSIS — F411 Generalized anxiety disorder: Secondary | ICD-10-CM | POA: Diagnosis not present

## 2020-11-06 DIAGNOSIS — G35 Multiple sclerosis: Secondary | ICD-10-CM | POA: Diagnosis not present

## 2020-11-06 DIAGNOSIS — M300 Polyarteritis nodosa: Secondary | ICD-10-CM | POA: Diagnosis not present

## 2020-11-06 DIAGNOSIS — F329 Major depressive disorder, single episode, unspecified: Secondary | ICD-10-CM | POA: Diagnosis not present

## 2020-11-06 DIAGNOSIS — E7849 Other hyperlipidemia: Secondary | ICD-10-CM | POA: Diagnosis not present

## 2020-11-06 DIAGNOSIS — K130 Diseases of lips: Secondary | ICD-10-CM | POA: Diagnosis not present

## 2020-11-06 DIAGNOSIS — E1165 Type 2 diabetes mellitus with hyperglycemia: Secondary | ICD-10-CM | POA: Diagnosis not present

## 2020-11-06 DIAGNOSIS — I10 Essential (primary) hypertension: Secondary | ICD-10-CM | POA: Diagnosis not present

## 2020-11-06 DIAGNOSIS — E559 Vitamin D deficiency, unspecified: Secondary | ICD-10-CM | POA: Diagnosis not present

## 2020-11-06 DIAGNOSIS — D735 Infarction of spleen: Secondary | ICD-10-CM | POA: Diagnosis not present

## 2020-11-06 DIAGNOSIS — R944 Abnormal results of kidney function studies: Secondary | ICD-10-CM | POA: Diagnosis not present

## 2020-11-06 DIAGNOSIS — G43909 Migraine, unspecified, not intractable, without status migrainosus: Secondary | ICD-10-CM | POA: Diagnosis not present

## 2021-01-13 DIAGNOSIS — Z6833 Body mass index (BMI) 33.0-33.9, adult: Secondary | ICD-10-CM | POA: Diagnosis not present

## 2021-01-13 DIAGNOSIS — E559 Vitamin D deficiency, unspecified: Secondary | ICD-10-CM | POA: Diagnosis not present

## 2021-01-13 DIAGNOSIS — G35 Multiple sclerosis: Secondary | ICD-10-CM | POA: Diagnosis not present

## 2021-01-13 DIAGNOSIS — E7849 Other hyperlipidemia: Secondary | ICD-10-CM | POA: Diagnosis not present

## 2021-01-13 DIAGNOSIS — M300 Polyarteritis nodosa: Secondary | ICD-10-CM | POA: Diagnosis not present

## 2021-01-13 DIAGNOSIS — E1165 Type 2 diabetes mellitus with hyperglycemia: Secondary | ICD-10-CM | POA: Diagnosis not present

## 2021-01-13 DIAGNOSIS — I10 Essential (primary) hypertension: Secondary | ICD-10-CM | POA: Diagnosis not present

## 2021-01-13 DIAGNOSIS — D735 Infarction of spleen: Secondary | ICD-10-CM | POA: Diagnosis not present

## 2021-02-02 DIAGNOSIS — Z1329 Encounter for screening for other suspected endocrine disorder: Secondary | ICD-10-CM | POA: Diagnosis not present

## 2021-02-02 DIAGNOSIS — E782 Mixed hyperlipidemia: Secondary | ICD-10-CM | POA: Diagnosis not present

## 2021-02-02 DIAGNOSIS — R5383 Other fatigue: Secondary | ICD-10-CM | POA: Diagnosis not present

## 2021-02-02 DIAGNOSIS — E876 Hypokalemia: Secondary | ICD-10-CM | POA: Diagnosis not present

## 2021-02-02 DIAGNOSIS — E7849 Other hyperlipidemia: Secondary | ICD-10-CM | POA: Diagnosis not present

## 2021-02-02 DIAGNOSIS — E1165 Type 2 diabetes mellitus with hyperglycemia: Secondary | ICD-10-CM | POA: Diagnosis not present

## 2021-02-09 DIAGNOSIS — F411 Generalized anxiety disorder: Secondary | ICD-10-CM | POA: Diagnosis not present

## 2021-02-09 DIAGNOSIS — E1165 Type 2 diabetes mellitus with hyperglycemia: Secondary | ICD-10-CM | POA: Diagnosis not present

## 2021-02-09 DIAGNOSIS — M300 Polyarteritis nodosa: Secondary | ICD-10-CM | POA: Diagnosis not present

## 2021-02-09 DIAGNOSIS — D735 Infarction of spleen: Secondary | ICD-10-CM | POA: Diagnosis not present

## 2021-02-09 DIAGNOSIS — E7849 Other hyperlipidemia: Secondary | ICD-10-CM | POA: Diagnosis not present

## 2021-02-09 DIAGNOSIS — E559 Vitamin D deficiency, unspecified: Secondary | ICD-10-CM | POA: Diagnosis not present

## 2021-02-09 DIAGNOSIS — G43909 Migraine, unspecified, not intractable, without status migrainosus: Secondary | ICD-10-CM | POA: Diagnosis not present

## 2021-02-09 DIAGNOSIS — I10 Essential (primary) hypertension: Secondary | ICD-10-CM | POA: Diagnosis not present

## 2021-02-13 DIAGNOSIS — M300 Polyarteritis nodosa: Secondary | ICD-10-CM | POA: Diagnosis not present

## 2021-02-13 DIAGNOSIS — Z79899 Other long term (current) drug therapy: Secondary | ICD-10-CM | POA: Diagnosis not present

## 2021-03-30 DIAGNOSIS — J209 Acute bronchitis, unspecified: Secondary | ICD-10-CM | POA: Diagnosis not present

## 2021-05-13 DIAGNOSIS — M300 Polyarteritis nodosa: Secondary | ICD-10-CM | POA: Diagnosis not present

## 2021-05-13 DIAGNOSIS — R2681 Unsteadiness on feet: Secondary | ICD-10-CM | POA: Diagnosis not present

## 2021-05-13 DIAGNOSIS — I776 Arteritis, unspecified: Secondary | ICD-10-CM | POA: Diagnosis not present

## 2021-05-13 DIAGNOSIS — D735 Infarction of spleen: Secondary | ICD-10-CM | POA: Diagnosis not present

## 2021-05-13 DIAGNOSIS — I7779 Dissection of other artery: Secondary | ICD-10-CM | POA: Diagnosis not present

## 2021-05-13 DIAGNOSIS — R5382 Chronic fatigue, unspecified: Secondary | ICD-10-CM | POA: Diagnosis not present

## 2021-05-13 DIAGNOSIS — G35 Multiple sclerosis: Secondary | ICD-10-CM | POA: Diagnosis not present

## 2021-05-13 DIAGNOSIS — G4719 Other hypersomnia: Secondary | ICD-10-CM | POA: Diagnosis not present

## 2021-05-18 DIAGNOSIS — E1165 Type 2 diabetes mellitus with hyperglycemia: Secondary | ICD-10-CM | POA: Diagnosis not present

## 2021-05-18 DIAGNOSIS — E7849 Other hyperlipidemia: Secondary | ICD-10-CM | POA: Diagnosis not present

## 2021-05-18 DIAGNOSIS — R7309 Other abnormal glucose: Secondary | ICD-10-CM | POA: Diagnosis not present

## 2021-05-18 DIAGNOSIS — E876 Hypokalemia: Secondary | ICD-10-CM | POA: Diagnosis not present

## 2021-05-18 DIAGNOSIS — E782 Mixed hyperlipidemia: Secondary | ICD-10-CM | POA: Diagnosis not present

## 2021-05-18 DIAGNOSIS — I10 Essential (primary) hypertension: Secondary | ICD-10-CM | POA: Diagnosis not present

## 2021-05-19 DIAGNOSIS — E559 Vitamin D deficiency, unspecified: Secondary | ICD-10-CM | POA: Diagnosis not present

## 2021-05-19 DIAGNOSIS — E1165 Type 2 diabetes mellitus with hyperglycemia: Secondary | ICD-10-CM | POA: Diagnosis not present

## 2021-05-19 DIAGNOSIS — E7849 Other hyperlipidemia: Secondary | ICD-10-CM | POA: Diagnosis not present

## 2021-05-19 DIAGNOSIS — G35 Multiple sclerosis: Secondary | ICD-10-CM | POA: Diagnosis not present

## 2021-05-19 DIAGNOSIS — R109 Unspecified abdominal pain: Secondary | ICD-10-CM | POA: Diagnosis not present

## 2021-05-19 DIAGNOSIS — I10 Essential (primary) hypertension: Secondary | ICD-10-CM | POA: Diagnosis not present

## 2021-05-19 DIAGNOSIS — D735 Infarction of spleen: Secondary | ICD-10-CM | POA: Diagnosis not present

## 2021-05-19 DIAGNOSIS — G43909 Migraine, unspecified, not intractable, without status migrainosus: Secondary | ICD-10-CM | POA: Diagnosis not present

## 2021-05-20 ENCOUNTER — Encounter (INDEPENDENT_AMBULATORY_CARE_PROVIDER_SITE_OTHER): Payer: Self-pay | Admitting: *Deleted

## 2021-05-22 DIAGNOSIS — R0789 Other chest pain: Secondary | ICD-10-CM | POA: Diagnosis not present

## 2021-05-22 DIAGNOSIS — K760 Fatty (change of) liver, not elsewhere classified: Secondary | ICD-10-CM | POA: Diagnosis not present

## 2021-05-22 DIAGNOSIS — R7989 Other specified abnormal findings of blood chemistry: Secondary | ICD-10-CM | POA: Diagnosis not present

## 2021-06-16 DIAGNOSIS — R202 Paresthesia of skin: Secondary | ICD-10-CM | POA: Diagnosis not present

## 2021-06-16 DIAGNOSIS — R2681 Unsteadiness on feet: Secondary | ICD-10-CM | POA: Diagnosis not present

## 2021-06-16 DIAGNOSIS — R29898 Other symptoms and signs involving the musculoskeletal system: Secondary | ICD-10-CM | POA: Diagnosis not present

## 2021-06-18 ENCOUNTER — Ambulatory Visit (INDEPENDENT_AMBULATORY_CARE_PROVIDER_SITE_OTHER): Payer: Medicare HMO | Admitting: Gastroenterology

## 2021-08-11 DIAGNOSIS — R059 Cough, unspecified: Secondary | ICD-10-CM | POA: Diagnosis not present

## 2021-08-11 DIAGNOSIS — Z20828 Contact with and (suspected) exposure to other viral communicable diseases: Secondary | ICD-10-CM | POA: Diagnosis not present

## 2021-08-11 DIAGNOSIS — J019 Acute sinusitis, unspecified: Secondary | ICD-10-CM | POA: Diagnosis not present

## 2021-08-12 DIAGNOSIS — S0033XA Contusion of nose, initial encounter: Secondary | ICD-10-CM | POA: Diagnosis not present

## 2021-08-12 DIAGNOSIS — G35 Multiple sclerosis: Secondary | ICD-10-CM | POA: Diagnosis not present

## 2021-08-12 DIAGNOSIS — M7981 Nontraumatic hematoma of soft tissue: Secondary | ICD-10-CM | POA: Diagnosis not present

## 2021-08-12 DIAGNOSIS — E785 Hyperlipidemia, unspecified: Secondary | ICD-10-CM | POA: Diagnosis not present

## 2021-08-12 DIAGNOSIS — S022XXA Fracture of nasal bones, initial encounter for closed fracture: Secondary | ICD-10-CM | POA: Diagnosis not present

## 2021-08-12 DIAGNOSIS — Z7902 Long term (current) use of antithrombotics/antiplatelets: Secondary | ICD-10-CM | POA: Diagnosis not present

## 2021-08-12 DIAGNOSIS — E7849 Other hyperlipidemia: Secondary | ICD-10-CM | POA: Diagnosis not present

## 2021-08-12 DIAGNOSIS — W1789XA Other fall from one level to another, initial encounter: Secondary | ICD-10-CM | POA: Diagnosis not present

## 2021-08-12 DIAGNOSIS — S0083XA Contusion of other part of head, initial encounter: Secondary | ICD-10-CM | POA: Diagnosis not present

## 2021-08-12 DIAGNOSIS — S0990XA Unspecified injury of head, initial encounter: Secondary | ICD-10-CM | POA: Diagnosis not present

## 2021-08-12 DIAGNOSIS — E876 Hypokalemia: Secondary | ICD-10-CM | POA: Diagnosis not present

## 2021-08-12 DIAGNOSIS — E1165 Type 2 diabetes mellitus with hyperglycemia: Secondary | ICD-10-CM | POA: Diagnosis not present

## 2021-08-12 DIAGNOSIS — Z043 Encounter for examination and observation following other accident: Secondary | ICD-10-CM | POA: Diagnosis not present

## 2021-08-12 DIAGNOSIS — R059 Cough, unspecified: Secondary | ICD-10-CM | POA: Diagnosis not present

## 2021-08-12 DIAGNOSIS — E782 Mixed hyperlipidemia: Secondary | ICD-10-CM | POA: Diagnosis not present

## 2021-08-12 DIAGNOSIS — R5383 Other fatigue: Secondary | ICD-10-CM | POA: Diagnosis not present

## 2021-08-12 DIAGNOSIS — I10 Essential (primary) hypertension: Secondary | ICD-10-CM | POA: Diagnosis not present

## 2021-08-18 DIAGNOSIS — I10 Essential (primary) hypertension: Secondary | ICD-10-CM | POA: Diagnosis not present

## 2021-08-18 DIAGNOSIS — J019 Acute sinusitis, unspecified: Secondary | ICD-10-CM | POA: Diagnosis not present

## 2021-08-18 DIAGNOSIS — G35 Multiple sclerosis: Secondary | ICD-10-CM | POA: Diagnosis not present

## 2021-08-18 DIAGNOSIS — E559 Vitamin D deficiency, unspecified: Secondary | ICD-10-CM | POA: Diagnosis not present

## 2021-08-18 DIAGNOSIS — M300 Polyarteritis nodosa: Secondary | ICD-10-CM | POA: Diagnosis not present

## 2021-08-18 DIAGNOSIS — E1165 Type 2 diabetes mellitus with hyperglycemia: Secondary | ICD-10-CM | POA: Diagnosis not present

## 2021-08-18 DIAGNOSIS — R109 Unspecified abdominal pain: Secondary | ICD-10-CM | POA: Diagnosis not present

## 2021-08-18 DIAGNOSIS — G43909 Migraine, unspecified, not intractable, without status migrainosus: Secondary | ICD-10-CM | POA: Diagnosis not present

## 2021-09-24 DIAGNOSIS — J3489 Other specified disorders of nose and nasal sinuses: Secondary | ICD-10-CM | POA: Diagnosis not present

## 2021-09-24 DIAGNOSIS — Z6834 Body mass index (BMI) 34.0-34.9, adult: Secondary | ICD-10-CM | POA: Diagnosis not present

## 2021-09-24 DIAGNOSIS — R202 Paresthesia of skin: Secondary | ICD-10-CM | POA: Diagnosis not present

## 2021-10-15 ENCOUNTER — Ambulatory Visit (INDEPENDENT_AMBULATORY_CARE_PROVIDER_SITE_OTHER): Payer: Medicare HMO | Admitting: Gastroenterology

## 2021-11-02 DIAGNOSIS — M792 Neuralgia and neuritis, unspecified: Secondary | ICD-10-CM | POA: Diagnosis not present

## 2021-11-02 DIAGNOSIS — G35 Multiple sclerosis: Secondary | ICD-10-CM | POA: Diagnosis not present

## 2021-11-02 DIAGNOSIS — R2681 Unsteadiness on feet: Secondary | ICD-10-CM | POA: Diagnosis not present

## 2021-11-18 DIAGNOSIS — E1165 Type 2 diabetes mellitus with hyperglycemia: Secondary | ICD-10-CM | POA: Diagnosis not present

## 2021-11-18 DIAGNOSIS — M300 Polyarteritis nodosa: Secondary | ICD-10-CM | POA: Diagnosis not present

## 2021-11-18 DIAGNOSIS — R109 Unspecified abdominal pain: Secondary | ICD-10-CM | POA: Diagnosis not present

## 2021-11-18 DIAGNOSIS — I10 Essential (primary) hypertension: Secondary | ICD-10-CM | POA: Diagnosis not present

## 2021-11-18 DIAGNOSIS — G43909 Migraine, unspecified, not intractable, without status migrainosus: Secondary | ICD-10-CM | POA: Diagnosis not present

## 2021-11-18 DIAGNOSIS — G35 Multiple sclerosis: Secondary | ICD-10-CM | POA: Diagnosis not present

## 2021-11-18 DIAGNOSIS — F329 Major depressive disorder, single episode, unspecified: Secondary | ICD-10-CM | POA: Diagnosis not present

## 2021-11-18 DIAGNOSIS — D735 Infarction of spleen: Secondary | ICD-10-CM | POA: Diagnosis not present

## 2021-11-18 DIAGNOSIS — Z0001 Encounter for general adult medical examination with abnormal findings: Secondary | ICD-10-CM | POA: Diagnosis not present

## 2021-11-18 DIAGNOSIS — F411 Generalized anxiety disorder: Secondary | ICD-10-CM | POA: Diagnosis not present

## 2021-11-18 DIAGNOSIS — E559 Vitamin D deficiency, unspecified: Secondary | ICD-10-CM | POA: Diagnosis not present

## 2021-11-18 DIAGNOSIS — E782 Mixed hyperlipidemia: Secondary | ICD-10-CM | POA: Diagnosis not present

## 2021-11-26 ENCOUNTER — Ambulatory Visit (INDEPENDENT_AMBULATORY_CARE_PROVIDER_SITE_OTHER): Payer: Medicare HMO | Admitting: Gastroenterology

## 2021-12-22 ENCOUNTER — Ambulatory Visit (INDEPENDENT_AMBULATORY_CARE_PROVIDER_SITE_OTHER): Payer: Medicare HMO | Admitting: Gastroenterology

## 2021-12-22 ENCOUNTER — Encounter (INDEPENDENT_AMBULATORY_CARE_PROVIDER_SITE_OTHER): Payer: Self-pay | Admitting: Gastroenterology

## 2021-12-22 VITALS — BP 136/96 | HR 96 | Temp 97.7°F | Ht 65.0 in | Wt 208.1 lb

## 2021-12-22 DIAGNOSIS — R1030 Lower abdominal pain, unspecified: Secondary | ICD-10-CM | POA: Diagnosis not present

## 2021-12-22 DIAGNOSIS — R1013 Epigastric pain: Secondary | ICD-10-CM

## 2021-12-22 DIAGNOSIS — K5900 Constipation, unspecified: Secondary | ICD-10-CM

## 2021-12-22 DIAGNOSIS — R1084 Generalized abdominal pain: Secondary | ICD-10-CM

## 2021-12-22 NOTE — Progress Notes (Signed)
? ?Referring Provider: Rosalee Kaufman, * ?Primary Care Physician:  Rosalee Kaufman, PA-C ?Primary GI Physician: new ? ?Chief Complaint  ?Patient presents with  ? Abdominal Pain  ?  Patient here today for abdominal pain that is in the mid upper portion. She has some issues with constipation. 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  ? ?HPI:   ?Amy Horton is a 51 y.o. female with past medical history of HRN, MS, depression and anxiety, HLD, Type II DM, polyarteritis nodosa.  ? ?Patient presenting today as a new patient for abdominal pain.  ? ?States that she was diagnosed with polyarteritis nodosa in 2019, was treated with chemo and high dose steroids. 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 TTE  revealed ascending aorta aneursym raising question of whether there was large vessel involvement, which would be highly unusual for PAN. In order to help clarify the diagnosis she underwent EGD and Colonoscopy (as was thought she may have vascular lesions in GI tract due to hx of iron deficiency anemia), no vasculitic lesions seen and all biopsies taken returned non-specific. Despite several features that did not fit perfectly with PAN rheumatology ultimately favored diagnosis of PAN. She was treated with 4 days of Pulse dose steroids, methylpred 1gm total per day 10/4-10/7, and received cytoxan '600mg'$ /m2 on 10/9. Attempted to transition to PO pred '90mg'$  however she continued to have significant pain with eating, steroids were changed to Methylpred '75mg'$  QD. Repeat CTA at that time showed no progression of her vasculitic involvement. Vascular surgery was consulted and  eventually decided to attempt stenting of the celiac artery which was successful). ? ?Today, For the past 6 months, she Endorses RLQ and LLQ abdominal pain as well. Having cramping prior to having a BM. She is taking metamucil pills and feels that she is back and forth between constipation with needing to strain and having fecal urgency and incontinence where she is having to run to the restroom. Denies rectal bleeding other than some very small amount of toilet tissue hematochezia after straining. No melena. Having significant constipation since stopping her metamucil pills.  Last BM was on Saturday, she states that stools were more like water. Prior to that she was feeling more constipated and having to strain a lot to go. Lower abdominal pain can be very severe but usually improves after having a BM.  ? ?She has been trying to change her diet some, however, she has found if she doesn't eat something greasy, she cannot have a BM. ? ?Has seen her doctor at [facility] and discussed ongoing symptoms but feels that they have not really wanted to pursue any further evaluation or treatment for anything as her symptoms are not as severe as previously and last CTA in September was stable. She reports that she has a lot of pain in her epigastric area, usually pain comes on if she eats, especially a larger meal. Upper Abdomen will become very tender as well. Denies any nausea, vomiting, GERD symptoms, changes in appetite or weight loss. She also notes some Mid to upper left quadrant pain associated with her epigastric pain.  ? ?NSAID OJJ:[id number] only on occasion ?Social hx: no tobacco, very rare etoh use ?Fam hx:no  crc or liver disease ? 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 No new or worsening dissection or aneurysm.  ?2.   Patent stented proximal celiac artery.  ? ?Last Colonoscopy:05/25/18 ?- the entire colon appeared normal  ?Last Endoscopy:05/25/18- localized white plaque at the GE junction noted in the lower third of esophagus; more likely dissolved pill and less likely candida ?- mild antral gastritis  ?- normal duodenum ?- Gastric antral mucosa with reactive gastropathy. Gastric oxyntic mucosa with no significant pathologic diagnosis. Negative for evidence of Helicobacter pylori on routine H&E ?- Esophageal squamous mucosa with no significant pathologic diagnosis ? ?Recommendations:  ? ? ?Past Medical History:  ?Diagnosis Date  ? Anxiety and depression   ? Headache(784.0)   ? Hypertension   ? Multiple sclerosis [facility]) March 1997  ? ? ?Past Surgical History:  ?Procedure Laterality Date  ? bone removed from left wrist     ? BREAST BIOPSY    ? teenager  ? CESAREAN SECTION  [id number]  ? x2  ? CHOLECYSTECTOMY  2002  ? stent in bile duct     ? TYMPANOSTOMY TUBE PLACEMENT    ? x2 as a child  ? ? ?Current Outpatient Medications  ?Medication Sig Dispense Refill  ? acyclovir (ZOVIRAX) 400 MG tablet Take 1 tablet by mouth as needed.    ? ALPRAZolam (XANAX) 1 MG tablet Take 1 mg by mouth at bedtime as needed for anxiety.    ? ARIPiprazole (ABILIFY) 5 MG tablet Take 5 mg by mouth daily.    ? buPROPion (WELLBUTRIN XL) 150 MG 24 hr tablet Take 1 tablet by mouth daily.  3  ? losartan (COZAAR) 50 MG tablet Take 100 mg by mouth daily.     ? OZEMPIC, 1 MG/DOSE, 4 MG/3ML SOPN Inject 1 mg into the skin once a week.    ? sertraline (ZOLOFT) 50 MG tablet Take 50 mg by mouth in the morning and at bedtime.  0  ? topiramate (TOPAMAX) 25 MG tablet Take 2 tablets by mouth 2 (two) times daily.  5  ? ?No current facility-administered medications for this visit.  ? ? ?Allergies as of 12/22/2021 - Review Complete 12/22/2021  ?Allergen Reaction Noted  ? Codeine  11/21/2012  ? ? ?Family History  ?Problem Relation Age of Onset  ? Hypertension Other   ? ? ?Social  History  ? ?Socioeconomic History  ? Marital status: Married  ?  Spouse name: Yvone Neu  ? Number of children: 3  ? Years of education: College  ? Highest education level: Not on file  ?Occupational History  ?  Comment: n/a  ?Tobacco Use  ? Smoking status: Never  ? Smokeless tobacco: Never  ?Vaping Use  ? Vaping Use: Not on file  ?Substance and Sexual Activity  ? Alcohol use: No  ? Drug use: No  ? Sexual activity: Not on file  ?Other Topics Concern  ? Not on file  ?Social History Narrative  ? Pt lives at home with her spouse and her children.   ? She does consume caffeine.  ? ?Social Determinants of Health  ? ?Financial Resource Strain: Not on file  ?Food Insecurity: Not on file  ?Transportation Needs: Not on file  ?Physical Activity: Not on file  ?Stress: Not on file  ?Social Connections: Not on file  ? ?Review  of systems ?General: negative for malaise, night sweats, fever, chills, weight loss ?Neck: Negative for lumps, goiter, pain and significant neck swelling ?Resp: Negative for cough, wheezing, dyspnea at rest ?CV: Negative for chest pain, leg swelling, palpitations, orthopnea ?GI: denies melena,  nausea, vomiting, dysphagia, odyonophagia, early satiety or unintentional weight loss. +epigastric pain +LUQ pain +constipation +lower abdominal pain +toilet tissue hematochezia ?MSK: Negative for joint pain or swelling, back pain, and muscle pain. ?Derm: Negative for itching or rash ?Psych: Denies depression, anxiety, memory loss, confusion. No homicidal or suicidal ideation.  ?Heme: Negative for prolonged bleeding, bruising easily, and swollen nodes. ?Endocrine: Negative for cold or heat intolerance, polyuria, polydipsia and goiter. ?Neuro: negative for tremor, gait imbalance, syncope and seizures. ?The remainder of the review of systems is noncontributory. ? ?Physical Exam: ?BP (!) 136/96 (BP Location: Left Arm, Patient Position: Sitting, Cuff Size: Large)   Pulse 96   Temp 97.7 ?F (36.5 ?C) (Oral)   Ht '5\' 5"'$  (1.651  m)   Wt 208 lb 1.6 oz (94.4 kg)   BMI 34.63 kg/m?  ?General:   Alert and oriented. No distress noted. Pleasant and cooperative.  ?Head:  Normocephalic and atraumatic. ?Eyes:  Conjuctiva clear without scleral icterus.

## 2021-12-22 NOTE — Patient Instructions (Signed)
Let's plan to do a repeat upper endoscopy, I am going to discuss your case with one of my attending doctors to get their recommendations on the possibility of also doing a colonoscopy, I will be in touch this afternoon or sometime tomorrow regarding this.  ?

## 2021-12-24 ENCOUNTER — Telehealth (INDEPENDENT_AMBULATORY_CARE_PROVIDER_SITE_OTHER): Payer: Self-pay | Admitting: *Deleted

## 2021-12-24 NOTE — Telephone Encounter (Signed)
Patient seen Tuesday and left voicemail today stating you was going to discuss her case with doctor and get back to her. Was asking for an update. She is out of town next week.  ? ?[phone] ?

## 2021-12-25 NOTE — Telephone Encounter (Signed)
Pt will be out of town til Thursday but states she can be reached on her mobile number [phone] ?

## 2021-12-28 DIAGNOSIS — K5900 Constipation, unspecified: Secondary | ICD-10-CM | POA: Insufficient documentation

## 2021-12-28 DIAGNOSIS — R1084 Generalized abdominal pain: Secondary | ICD-10-CM | POA: Insufficient documentation

## 2021-12-28 NOTE — Telephone Encounter (Signed)
Left message to return call 

## 2021-12-29 DIAGNOSIS — R1013 Epigastric pain: Secondary | ICD-10-CM | POA: Insufficient documentation

## 2021-12-29 NOTE — Telephone Encounter (Signed)
Left message to return call 

## 2021-12-29 NOTE — Telephone Encounter (Signed)
Discussed with patient  per chelsea - We can repeat CT angio of her abdomen/pelvis to make sure no worsening of vacular issues causing her pain and get her scheduled for EGD. Can consider doing colonoscopy if no findings on CT.   ?Pt wants to go ahead and do CT scan first and then after getting results schedule egd in case she needs tcs.  ? ?Leigh ann please schedule ct and pt states she can go anyday.  ?

## 2021-12-30 ENCOUNTER — Other Ambulatory Visit (INDEPENDENT_AMBULATORY_CARE_PROVIDER_SITE_OTHER): Payer: Self-pay

## 2021-12-30 DIAGNOSIS — D735 Infarction of spleen: Secondary | ICD-10-CM

## 2021-12-30 DIAGNOSIS — R1084 Generalized abdominal pain: Secondary | ICD-10-CM

## 2021-12-30 DIAGNOSIS — R1030 Lower abdominal pain, unspecified: Secondary | ICD-10-CM

## 2022-01-01 ENCOUNTER — Ambulatory Visit (HOSPITAL_COMMUNITY)
Admission: RE | Admit: 2022-01-01 | Discharge: 2022-01-01 | Disposition: A | Discharge status: Home / Self Care | Payer: Medicare HMO | Source: Ambulatory Visit | Attending: Gastroenterology | Admitting: Gastroenterology

## 2022-01-01 DIAGNOSIS — R1030 Lower abdominal pain, unspecified: Secondary | ICD-10-CM | POA: Diagnosis not present

## 2022-01-01 DIAGNOSIS — R1084 Generalized abdominal pain: Secondary | ICD-10-CM | POA: Diagnosis not present

## 2022-01-01 DIAGNOSIS — D735 Infarction of spleen: Secondary | ICD-10-CM | POA: Insufficient documentation

## 2022-01-01 DIAGNOSIS — K429 Umbilical hernia without obstruction or gangrene: Secondary | ICD-10-CM | POA: Diagnosis not present

## 2022-01-01 DIAGNOSIS — I723 Aneurysm of iliac artery: Secondary | ICD-10-CM | POA: Diagnosis not present

## 2022-01-01 MED ORDER — IOHEXOL 350 MG/ML SOLN
100.0000 mL | Freq: Once | INTRAVENOUS | Status: AC | PRN
  Administered 2022-01-01: 100 mL via INTRAVENOUS

## 2022-01-05 LAB — POCT I-STAT CREATININE: Creatinine, Ser: 1.3 mg/dL — ABNORMAL HIGH (ref 0.44–1.00)

## 2022-01-12 ENCOUNTER — Other Ambulatory Visit (INDEPENDENT_AMBULATORY_CARE_PROVIDER_SITE_OTHER): Payer: Self-pay

## 2022-01-12 ENCOUNTER — Telehealth (INDEPENDENT_AMBULATORY_CARE_PROVIDER_SITE_OTHER): Payer: Self-pay

## 2022-01-12 ENCOUNTER — Encounter (INDEPENDENT_AMBULATORY_CARE_PROVIDER_SITE_OTHER): Payer: Self-pay

## 2022-01-12 DIAGNOSIS — R1030 Lower abdominal pain, unspecified: Secondary | ICD-10-CM

## 2022-01-12 DIAGNOSIS — R1084 Generalized abdominal pain: Secondary | ICD-10-CM

## 2022-01-12 DIAGNOSIS — K5900 Constipation, unspecified: Secondary | ICD-10-CM

## 2022-01-12 MED ORDER — NA SULFATE-K SULFATE-MG SULF 17.5-3.13-1.6 GM/177ML PO SOLN: 1.0000 | ORAL | 0 refills | Status: DC

## 2022-01-12 NOTE — Telephone Encounter (Signed)
Amy Horton Ann Merrell Borsuk, CMA  ?

## 2022-01-22 ENCOUNTER — Encounter (INDEPENDENT_AMBULATORY_CARE_PROVIDER_SITE_OTHER): Payer: Self-pay

## 2022-01-25 DIAGNOSIS — I712 Thoracic aortic aneurysm, without rupture, unspecified: Secondary | ICD-10-CM | POA: Diagnosis not present

## 2022-01-25 DIAGNOSIS — L509 Urticaria, unspecified: Secondary | ICD-10-CM | POA: Diagnosis not present

## 2022-01-25 DIAGNOSIS — R030 Elevated blood-pressure reading, without diagnosis of hypertension: Secondary | ICD-10-CM | POA: Diagnosis not present

## 2022-01-25 DIAGNOSIS — Z6833 Body mass index (BMI) 33.0-33.9, adult: Secondary | ICD-10-CM | POA: Diagnosis not present

## 2022-02-11 DIAGNOSIS — E7849 Other hyperlipidemia: Secondary | ICD-10-CM | POA: Diagnosis not present

## 2022-02-11 DIAGNOSIS — D849 Immunodeficiency, unspecified: Secondary | ICD-10-CM | POA: Diagnosis not present

## 2022-02-11 DIAGNOSIS — E782 Mixed hyperlipidemia: Secondary | ICD-10-CM | POA: Diagnosis not present

## 2022-02-11 DIAGNOSIS — R5383 Other fatigue: Secondary | ICD-10-CM | POA: Diagnosis not present

## 2022-02-11 DIAGNOSIS — E1165 Type 2 diabetes mellitus with hyperglycemia: Secondary | ICD-10-CM | POA: Diagnosis not present

## 2022-02-11 DIAGNOSIS — R1013 Epigastric pain: Secondary | ICD-10-CM | POA: Diagnosis not present

## 2022-02-18 DIAGNOSIS — D735 Infarction of spleen: Secondary | ICD-10-CM | POA: Diagnosis not present

## 2022-02-18 DIAGNOSIS — G43909 Migraine, unspecified, not intractable, without status migrainosus: Secondary | ICD-10-CM | POA: Diagnosis not present

## 2022-02-18 DIAGNOSIS — E7849 Other hyperlipidemia: Secondary | ICD-10-CM | POA: Diagnosis not present

## 2022-02-18 DIAGNOSIS — E1165 Type 2 diabetes mellitus with hyperglycemia: Secondary | ICD-10-CM | POA: Diagnosis not present

## 2022-02-18 DIAGNOSIS — I10 Essential (primary) hypertension: Secondary | ICD-10-CM | POA: Diagnosis not present

## 2022-02-18 DIAGNOSIS — M300 Polyarteritis nodosa: Secondary | ICD-10-CM | POA: Diagnosis not present

## 2022-02-18 DIAGNOSIS — F329 Major depressive disorder, single episode, unspecified: Secondary | ICD-10-CM | POA: Diagnosis not present

## 2022-02-18 DIAGNOSIS — E559 Vitamin D deficiency, unspecified: Secondary | ICD-10-CM | POA: Diagnosis not present

## 2022-02-18 DIAGNOSIS — G35 Multiple sclerosis: Secondary | ICD-10-CM | POA: Diagnosis not present

## 2022-02-25 DIAGNOSIS — E876 Hypokalemia: Secondary | ICD-10-CM | POA: Diagnosis not present

## 2022-02-25 DIAGNOSIS — R5383 Other fatigue: Secondary | ICD-10-CM | POA: Diagnosis not present

## 2022-02-25 DIAGNOSIS — I10 Essential (primary) hypertension: Secondary | ICD-10-CM | POA: Diagnosis not present

## 2022-02-25 DIAGNOSIS — E1165 Type 2 diabetes mellitus with hyperglycemia: Secondary | ICD-10-CM | POA: Diagnosis not present

## 2022-03-01 ENCOUNTER — Encounter (HOSPITAL_COMMUNITY): Payer: Medicare HMO

## 2022-03-30 ENCOUNTER — Encounter (HOSPITAL_COMMUNITY): Payer: Medicare HMO

## 2022-04-15 ENCOUNTER — Encounter (HOSPITAL_COMMUNITY)
Admission: RE | Admit: 2022-04-15 | Discharge: 2022-04-15 | Disposition: A | Discharge status: Home / Self Care | Payer: Medicare HMO | Source: Ambulatory Visit | Attending: Gastroenterology | Admitting: Gastroenterology

## 2022-04-15 ENCOUNTER — Encounter (HOSPITAL_COMMUNITY): Payer: Self-pay

## 2022-04-15 VITALS — Ht 65.0 in | Wt 195.0 lb

## 2022-04-15 DIAGNOSIS — Z01818 Encounter for other preprocedural examination: Secondary | ICD-10-CM

## 2022-04-15 HISTORY — DX: Other specified postprocedural states: Z98.890

## 2022-04-15 HISTORY — DX: Type 2 diabetes mellitus without complications: E11.9

## 2022-04-16 ENCOUNTER — Encounter (INDEPENDENT_AMBULATORY_CARE_PROVIDER_SITE_OTHER): Payer: Self-pay

## 2022-04-22 ENCOUNTER — Encounter (HOSPITAL_COMMUNITY): Payer: Self-pay | Admitting: Gastroenterology

## 2022-04-22 ENCOUNTER — Ambulatory Visit (HOSPITAL_BASED_OUTPATIENT_CLINIC_OR_DEPARTMENT_OTHER): Payer: Medicare HMO | Admitting: Anesthesiology

## 2022-04-22 ENCOUNTER — Ambulatory Visit (HOSPITAL_COMMUNITY)
Admission: RE | Admit: 2022-04-22 | Discharge: 2022-04-22 | Disposition: A | Discharge status: Home / Self Care | Payer: Medicare HMO | Attending: Gastroenterology | Admitting: Gastroenterology

## 2022-04-22 ENCOUNTER — Ambulatory Visit (HOSPITAL_COMMUNITY): Payer: Medicare HMO | Admitting: Anesthesiology

## 2022-04-22 ENCOUNTER — Encounter (HOSPITAL_COMMUNITY)
Admission: RE | Disposition: A | Discharge status: Home / Self Care | Payer: Self-pay | Source: Home / Self Care | Attending: Gastroenterology

## 2022-04-22 DIAGNOSIS — K259 Gastric ulcer, unspecified as acute or chronic, without hemorrhage or perforation: Secondary | ICD-10-CM

## 2022-04-22 DIAGNOSIS — K317 Polyp of stomach and duodenum: Secondary | ICD-10-CM | POA: Insufficient documentation

## 2022-04-22 DIAGNOSIS — D735 Infarction of spleen: Secondary | ICD-10-CM | POA: Diagnosis not present

## 2022-04-22 DIAGNOSIS — R109 Unspecified abdominal pain: Secondary | ICD-10-CM | POA: Diagnosis not present

## 2022-04-22 DIAGNOSIS — K319 Disease of stomach and duodenum, unspecified: Secondary | ICD-10-CM | POA: Diagnosis not present

## 2022-04-22 DIAGNOSIS — D649 Anemia, unspecified: Secondary | ICD-10-CM | POA: Insufficient documentation

## 2022-04-22 DIAGNOSIS — D123 Benign neoplasm of transverse colon: Secondary | ICD-10-CM | POA: Insufficient documentation

## 2022-04-22 DIAGNOSIS — G35 Multiple sclerosis: Secondary | ICD-10-CM | POA: Insufficient documentation

## 2022-04-22 DIAGNOSIS — K3189 Other diseases of stomach and duodenum: Secondary | ICD-10-CM | POA: Insufficient documentation

## 2022-04-22 DIAGNOSIS — Z01818 Encounter for other preprocedural examination: Secondary | ICD-10-CM

## 2022-04-22 DIAGNOSIS — K635 Polyp of colon: Secondary | ICD-10-CM

## 2022-04-22 DIAGNOSIS — F419 Anxiety disorder, unspecified: Secondary | ICD-10-CM | POA: Insufficient documentation

## 2022-04-22 DIAGNOSIS — Z7984 Long term (current) use of oral hypoglycemic drugs: Secondary | ICD-10-CM | POA: Diagnosis not present

## 2022-04-22 DIAGNOSIS — R634 Abnormal weight loss: Secondary | ICD-10-CM | POA: Diagnosis not present

## 2022-04-22 DIAGNOSIS — F32A Depression, unspecified: Secondary | ICD-10-CM | POA: Diagnosis not present

## 2022-04-22 DIAGNOSIS — K2950 Unspecified chronic gastritis without bleeding: Secondary | ICD-10-CM | POA: Diagnosis not present

## 2022-04-22 DIAGNOSIS — E785 Hyperlipidemia, unspecified: Secondary | ICD-10-CM | POA: Insufficient documentation

## 2022-04-22 DIAGNOSIS — M300 Polyarteritis nodosa: Secondary | ICD-10-CM | POA: Insufficient documentation

## 2022-04-22 DIAGNOSIS — E119 Type 2 diabetes mellitus without complications: Secondary | ICD-10-CM | POA: Diagnosis not present

## 2022-04-22 DIAGNOSIS — R1013 Epigastric pain: Secondary | ICD-10-CM | POA: Insufficient documentation

## 2022-04-22 DIAGNOSIS — I10 Essential (primary) hypertension: Secondary | ICD-10-CM | POA: Insufficient documentation

## 2022-04-22 DIAGNOSIS — R1030 Lower abdominal pain, unspecified: Secondary | ICD-10-CM

## 2022-04-22 DIAGNOSIS — D759 Disease of blood and blood-forming organs, unspecified: Secondary | ICD-10-CM | POA: Diagnosis not present

## 2022-04-22 DIAGNOSIS — D132 Benign neoplasm of duodenum: Secondary | ICD-10-CM | POA: Diagnosis not present

## 2022-04-22 DIAGNOSIS — R1084 Generalized abdominal pain: Secondary | ICD-10-CM

## 2022-04-22 DIAGNOSIS — K5900 Constipation, unspecified: Secondary | ICD-10-CM

## 2022-04-22 HISTORY — PX: POLYPECTOMY: SHX149

## 2022-04-22 HISTORY — PX: ESOPHAGOGASTRODUODENOSCOPY (EGD) WITH PROPOFOL: SHX5813

## 2022-04-22 HISTORY — PX: BIOPSY: SHX5522

## 2022-04-22 HISTORY — PX: COLONOSCOPY WITH PROPOFOL: SHX5780

## 2022-04-22 LAB — GLUCOSE, CAPILLARY: Glucose-Capillary: 152 mg/dL — ABNORMAL HIGH (ref 70–99)

## 2022-04-22 LAB — HM COLONOSCOPY

## 2022-04-22 SURGERY — COLONOSCOPY WITH PROPOFOL
Anesthesia: General

## 2022-04-22 MED ORDER — PROPOFOL 500 MG/50ML IV EMUL
INTRAVENOUS | Status: DC | PRN
  Administered 2022-04-22: 200 ug/kg/min via INTRAVENOUS

## 2022-04-22 MED ORDER — LIDOCAINE HCL (CARDIAC) PF 100 MG/5ML IV SOSY
PREFILLED_SYRINGE | INTRAVENOUS | Status: DC | PRN
  Administered 2022-04-22: 100 mg via INTRAVENOUS

## 2022-04-22 MED ORDER — PROPOFOL 10 MG/ML IV BOLUS
INTRAVENOUS | Status: DC | PRN
  Administered 2022-04-22: 20 mg via INTRAVENOUS
  Administered 2022-04-22: 80 mg via INTRAVENOUS

## 2022-04-22 MED ORDER — PHENYLEPHRINE 80 MCG/ML (10ML) SYRINGE FOR IV PUSH (FOR BLOOD PRESSURE SUPPORT)
PREFILLED_SYRINGE | INTRAVENOUS | Status: DC | PRN
  Administered 2022-04-22 (×2): 80 ug via INTRAVENOUS

## 2022-04-22 MED ORDER — LACTATED RINGERS IV SOLN
INTRAVENOUS | Status: DC
  Administered 2022-04-22: 1000 mL via INTRAVENOUS

## 2022-04-22 MED ORDER — OMEPRAZOLE 40 MG PO CPDR: 40.0000 mg | DELAYED_RELEASE_CAPSULE | Freq: Every day | ORAL | 3 refills | Status: AC

## 2022-04-22 NOTE — Op Note (Signed)
[facility] Patient Name: Amy Horton Procedure Date: 04/22/2022 8:46 AM MRN: [id number] Date of Birth: Sep 18, 1970 Attending MD: Maylon Peppers ,  CSN: [id number] Age: 51 Admit Type: Outpatient Procedure:                Colonoscopy Indications:              Abdominal pain Providers:                Maylon Peppers, Baker Page, Everardo Pacific,                            [place] Risa Grill, Technician Referring MD:              Medicines:                Monitored Anesthesia Care Complications:            No immediate complications. Estimated Blood Loss:     Estimated blood loss: none. Procedure:                Pre-Anesthesia Assessment:                           - Prior to the procedure, a History and Physical                            was performed, and patient medications, allergies                            and sensitivities were reviewed. The patient's                            tolerance of previous anesthesia was reviewed.                           - The risks and benefits of the procedure and the                            sedation options and risks were discussed with the                            patient. All questions were answered and informed                            consent was obtained.                           - ASA Grade Assessment: II - A patient with mild                            systemic disease.                           After obtaining informed consent, the colonoscope                            was passed under direct vision. Throughout the  procedure, the patient's blood pressure, pulse, and                            oxygen saturations were monitored continuously. The                            [id number]-HQ190L ([id number]) scope was introduced through                            the anus and advanced to the the terminal ileum.                            The colonoscopy was performed without difficulty.                             The patient tolerated the procedure well. The                            quality of the bowel preparation was adequate. Scope In: 9:14:41 AM Scope Out: 9:42:51 AM Scope Withdrawal Time: 0 hours 22 minutes 44 seconds  Total Procedure Duration: 0 hours 28 minutes 10 seconds  Findings:      The perianal and digital rectal examinations were normal.      The terminal ileum appeared normal.      Three sessile polyps were found in the sigmoid colon and transverse       colon. The polyps were 3 to 5 mm in size. These polyps were removed with       a cold snare. Resection and retrieval were complete.      The retroflexed view of the distal rectum and anal verge was normal and       showed no anal or rectal abnormalities. Impression:               - The examined portion of the ileum was normal.                           - Three 3 to 5 mm polyps in the sigmoid colon and                            in the transverse colon, removed with a cold snare.                            Resected and retrieved.                           - The distal rectum and anal verge are normal on                            retroflexion view. Moderate Sedation:      Per Anesthesia Care Recommendation:           - Discharge patient to home (ambulatory).                           - Resume previous diet.                           -  Await pathology results.                           - Repeat colonoscopy for surveillance based on                            pathology results. Procedure Code(s):        --- Professional ---                           [phone], Colonoscopy, flexible; with removal of                            tumor(s), polyp(s), or other lesion(s) by snare                            technique Diagnosis Code(s):        --- Professional ---                           K63.5, Polyp of colon                           R10.9, Unspecified abdominal pain CPT copyright 2019 American Medical Association. All rights  reserved. The codes documented in this report are preliminary and upon coder review may  be revised to meet current compliance requirements. Maylon Peppers, MD Maylon Peppers,  04/22/2022 9:48:25 AM This report has been signed electronically. Number of Addenda: 0

## 2022-04-22 NOTE — Anesthesia Postprocedure Evaluation (Signed)
Anesthesia Post Note  Patient: Amy Horton  Procedure(s) Performed: COLONOSCOPY WITH PROPOFOL ESOPHAGOGASTRODUODENOSCOPY (EGD) WITH PROPOFOL BIOPSY POLYPECTOMY INTESTINAL  Patient location during evaluation: Phase II Anesthesia Type: General Level of consciousness: awake and alert and oriented Pain management: pain level controlled Vital Signs Assessment: post-procedure vital signs reviewed and stable Respiratory status: spontaneous breathing, nonlabored ventilation and respiratory function stable Cardiovascular status: blood pressure returned to baseline and stable Postop Assessment: no apparent nausea or vomiting Anesthetic complications: no   No notable events documented.   Last Vitals:  Vitals:   04/22/22 0735 04/22/22 0946  BP: (!) 119/98 (!) 126/93  Pulse:  61  Resp: 18 19  Temp: 36.7 C 36.7 C  SpO2: 96% 94%    Last Pain:  Vitals:   04/22/22 0946  TempSrc: Oral  PainSc: 0-No pain                 Eulah Walkup C Mikhayla Phillis

## 2022-04-22 NOTE — H&P (Signed)
Amy Horton is an 51 y.o. female.   Chief Complaint: Chronic abdominal pain and weight loss HPI: Amy Horton is a 51 y.o. female with past medical history of HRN, MS, depression and anxiety, HLD, Type II DM, polyarteritis nodosa, coming for evaluation of chronic abdominal pain and weight loss.  The patient reports a chronic history of abdominal pain for the last 10 years which has worsened recently.  Denies any nausea, vomiting, fever, chills, abdominal distention, melena, hematochezia.  Reports having some weight loss for the last few months, close to 15 pounds due to pain when she eats food.  CT angio of the abdomen and pelvis with IV contrast did not show any significant arterial compromise in the splanchnic vasculature.  Past Medical History:  Diagnosis Date   Anxiety and depression    Diabetes mellitus without complication ([place])    [facility](947.0)    Hypertension    Multiple sclerosis ([place]) 10/1995   PONV (postoperative nausea and vomiting)     Past Surgical History:  Procedure Laterality Date   bone removed from left wrist      BREAST BIOPSY     teenager   CESAREAN SECTION  2003,2006   x2   CHOLECYSTECTOMY  2002   stent in bile duct      TYMPANOSTOMY TUBE PLACEMENT     x2 as a child    Family History  Problem Relation Age of Onset   Hypertension Other    Social History:  reports that she has never smoked. She has never used smokeless tobacco. She reports that she does not drink alcohol and does not use drugs.  Allergies:  Allergies  Allergen Reactions   Codeine     Medications Prior to Admission  Medication Sig Dispense Refill   acyclovir (ZOVIRAX) 400 MG tablet Take 1 tablet by mouth as needed.     ALPRAZolam (XANAX) 1 MG tablet Take 1 mg by mouth at bedtime as needed for anxiety.     ARIPiprazole (ABILIFY) 5 MG tablet Take 5 mg by mouth daily.     buPROPion (WELLBUTRIN XL) 150 MG 24 hr tablet Take 1 tablet by mouth daily.  3   losartan (COZAAR) 50 MG  tablet Take 100 mg by mouth daily.      Na Sulfate-K Sulfate-Mg Sulf (SUPREP BOWEL PREP KIT) 17.5-3.13-1.6 GM/177ML SOLN Take 1 kit by mouth as directed. 354 mL 0   sertraline (ZOLOFT) 50 MG tablet Take 50 mg by mouth in the morning and at bedtime.  0   topiramate (TOPAMAX) 25 MG tablet Take 2 tablets by mouth 2 (two) times daily.  5   OZEMPIC, 1 MG/DOSE, 4 MG/3ML SOPN Inject 1 mg into the skin once a week.      Results for orders placed or performed during the hospital encounter of 04/22/22 (from the past 48 hour(s))  Glucose, capillary     Status: Abnormal   Collection Time: 04/22/22  7:41 AM  Result Value Ref Range   Glucose-Capillary 152 (H) 70 - 99 mg/dL    Comment: Glucose reference range applies only to samples taken after fasting for at least 8 hours.   No results found.  Review of Systems  Constitutional:  Positive for unexpected weight change.  HENT: Negative.    Eyes: Negative.   Respiratory: Negative.    Cardiovascular: Negative.   Gastrointestinal:  Positive for abdominal pain.  Endocrine: Negative.   Genitourinary: Negative.   Musculoskeletal: Negative.   Skin: Negative.   Allergic/Immunologic:  Negative.   Neurological: Negative.   Hematological: Negative.   Psychiatric/Behavioral: Negative.      Blood pressure (!) 119/98, temperature 98.1 F (36.7 C), temperature source Oral, resp. rate 18, last menstrual period 06/16/2021, SpO2 96 %. Physical Exam  GENERAL: The patient is AO x3, in no acute distress. HEENT: Head is normocephalic and atraumatic. EOMI are intact. Mouth is well hydrated and without lesions. NECK: Supple. No masses LUNGS: Clear to auscultation. No presence of rhonchi/wheezing/rales. Adequate chest expansion HEART: RRR, normal s1 and s2. ABDOMEN: Soft, nontender, no guarding, no peritoneal signs, and nondistended. BS +. No masses. EXTREMITIES: Without any cyanosis, clubbing, rash, lesions or edema. NEUROLOGIC: AOx3, no focal motor deficit. SKIN:  no jaundice, no rashes  Assessment/Plan Amy Horton is a 51 y.o. female with past medical history of HRN, MS, depression and anxiety, HLD, Type II DM, polyarteritis nodosa, coming for evaluation of chronic abdominal pain and weight loss.  We will proceed with EGD and colonoscopy.  Harvel Quale, MD 04/22/2022, 8:50 AM

## 2022-04-22 NOTE — Op Note (Signed)
[facility] Patient Name: Amy Horton Procedure Date: 04/22/2022 8:45 AM MRN: [id number] Date of Birth: 1971-05-15 Attending MD: Maylon Peppers ,  CSN: [id number] Age: 51 Admit Type: Outpatient Procedure:                Upper GI endoscopy Indications:              Epigastric abdominal pain Providers:                Maylon Peppers, [place] Page, Everardo Pacific,                            [place] Risa Grill, Technician Referring MD:              Medicines:                Monitored Anesthesia Care Complications:            No immediate complications. Estimated Blood Loss:     Estimated blood loss: none. Procedure:                Pre-Anesthesia Assessment:                           - Prior to the procedure, a History and Physical                            was performed, and patient medications, allergies                            and sensitivities were reviewed. The patient's                            tolerance of previous anesthesia was reviewed.                           - The risks and benefits of the procedure and the                            sedation options and risks were discussed with the                            patient. All questions were answered and informed                            consent was obtained.                           - ASA Grade Assessment: II - A patient with mild                            systemic disease.                           After obtaining informed consent, the endoscope was                            passed under direct vision. Throughout the  procedure, the patient's blood pressure, pulse, and                            oxygen saturations were monitored continuously. The                            [id number]-[id number] ([id number]) scope was introduced through the                            mouth, and advanced to the second part of duodenum.                            The upper GI endoscopy was accomplished without                             difficulty. The patient tolerated the procedure                            well. Scope In: 8:55:30 AM Scope Out: 9:09:04 AM Total Procedure Duration: 0 hours 13 minutes 34 seconds  Findings:      The examined esophagus was normal.      The gastroesophageal flap valve was visualized endoscopically and       classified as Hill Grade II (fold present, opens with respiration).      A few localized diminutive erosions with no bleeding and no stigmata of       recent bleeding were found in the gastric antrum. Biopsies were taken       with a cold forceps for Helicobacter pylori testing.      A single 4 mm sessile polyp with no bleeding was found in the second       portion of the duodenum. The polyp was removed with a cold snare.       Resection and retrieval were complete. Impression:               - Normal esophagus.                           - Gastroesophageal flap valve classified as Hill                            Grade II (fold present, opens with respiration).                           - Erosive gastropathy with no bleeding and no                            stigmata of recent bleeding. Biopsied.                           - A single duodenal polyp. Resected and retrieved. Moderate Sedation:      Per Anesthesia Care Recommendation:           - Discharge patient to home (ambulatory).                           - Resume  previous diet.                           - Await pathology results.                           -Start omeprazole 40 mg qday. Procedure Code(s):        --- Professional ---                           [phone], Esophagogastroduodenoscopy, flexible,                            transoral; with removal of tumor(s), polyp(s), or                            other lesion(s) by snare technique                           43239, 23, Esophagogastroduodenoscopy, flexible,                            transoral; with biopsy, single or multiple Diagnosis Code(s):         --- Professional ---                           K31.89, Other diseases of stomach and duodenum                           K31.7, Polyp of stomach and duodenum                           R10.13, Epigastric pain CPT copyright 2019 American Medical Association. All rights reserved. The codes documented in this report are preliminary and upon coder review may  be revised to meet current compliance requirements. Maylon Peppers, MD Maylon Peppers,  04/22/2022 9:13:45 AM This report has been signed electronically. Number of Addenda: 0

## 2022-04-22 NOTE — Anesthesia Preprocedure Evaluation (Signed)
Anesthesia Evaluation  Patient identified by MRN, date of birth, ID band Patient awake    Reviewed: Allergy & Precautions, NPO status , Patient's Chart, lab work & pertinent test results  History of Anesthesia Complications (+) PONV and history of anesthetic complications  Airway Mallampati: III  TM Distance: >3 FB Neck ROM: Full    Dental  (+) Dental Advisory Given, Upper Dentures, Lower Dentures   Pulmonary neg pulmonary ROS,    Pulmonary exam normal breath sounds clear to auscultation       Cardiovascular Exercise Tolerance: Good METS (gait instability): hypertension, Pt. on medications Normal cardiovascular exam Rhythm:Regular Rate:Normal     Neuro/Psych  Headaches, PSYCHIATRIC DISORDERS Anxiety Depression  Neuromuscular disease (multiple sclerosis)    GI/Hepatic negative GI ROS, Neg liver ROS,   Endo/Other  diabetes, Well Controlled, Type 2, Oral Hypoglycemic Agents  Renal/GU negative Renal ROS  negative genitourinary   Musculoskeletal negative musculoskeletal ROS (+)   Abdominal   Peds negative pediatric ROS (+)  Hematology  (+) Blood dyscrasia, anemia ,   Anesthesia Other Findings Spleen infarction   Reproductive/Obstetrics negative OB ROS                            Anesthesia Physical Anesthesia Plan  ASA: 2  Anesthesia Plan: General   Post-op Pain Management: Minimal or no pain anticipated   Induction: Intravenous  PONV Risk Score and Plan: Propofol infusion  Airway Management Planned: Nasal Cannula and Natural Airway  Additional Equipment:   Intra-op Plan:   Post-operative Plan:   Informed Consent: I have reviewed the patients History and Physical, chart, labs and discussed the procedure including the risks, benefits and alternatives for the proposed anesthesia with the patient or authorized representative who has indicated his/her understanding and acceptance.      Dental advisory given  Plan Discussed with: CRNA and Surgeon  Anesthesia Plan Comments:         Anesthesia Quick Evaluation

## 2022-04-22 NOTE — Transfer of Care (Signed)
Immediate Anesthesia Transfer of Care Note  Patient: Amy Horton  Procedure(s) Performed: COLONOSCOPY WITH PROPOFOL ESOPHAGOGASTRODUODENOSCOPY (EGD) WITH PROPOFOL BIOPSY POLYPECTOMY INTESTINAL  Patient Location: PACU  Anesthesia Type:MAC  Level of Consciousness: drowsy and patient cooperative  Airway & Oxygen Therapy: Patient Spontanous Breathing  Post-op Assessment: Report given to RN and Post -op Vital signs reviewed and stable  Post vital signs: Reviewed and stable  Last Vitals:  Vitals Value Taken Time  BP 126/93 04/22/22 0946  Temp 36.7 C 04/22/22 0946  Pulse 61 04/22/22 0946  Resp 19 04/22/22 0946  SpO2 94 % 04/22/22 0946    Last Pain:  Vitals:   04/22/22 0946  TempSrc: Oral  PainSc: 0-No pain      Patients Stated Pain Goal: 7 (78/67/54 4920)  Complications: No notable events documented.

## 2022-04-22 NOTE — Discharge Instructions (Addendum)
You are being discharged to home.  Resume your previous diet.  We are waiting for your pathology results.  Start omeprazole 40 mg qday. Your physician has recommended a repeat colonoscopy for surveillance based on pathology results.  

## 2022-04-26 ENCOUNTER — Encounter (INDEPENDENT_AMBULATORY_CARE_PROVIDER_SITE_OTHER): Payer: Self-pay | Admitting: *Deleted

## 2022-04-26 LAB — SURGICAL PATHOLOGY

## 2022-04-29 ENCOUNTER — Encounter (HOSPITAL_COMMUNITY): Payer: Self-pay | Admitting: Gastroenterology

## 2022-05-18 DIAGNOSIS — R739 Hyperglycemia, unspecified: Secondary | ICD-10-CM | POA: Diagnosis not present

## 2022-05-18 DIAGNOSIS — I10 Essential (primary) hypertension: Secondary | ICD-10-CM | POA: Diagnosis not present

## 2022-05-18 DIAGNOSIS — E876 Hypokalemia: Secondary | ICD-10-CM | POA: Diagnosis not present

## 2022-05-18 DIAGNOSIS — E782 Mixed hyperlipidemia: Secondary | ICD-10-CM | POA: Diagnosis not present

## 2022-05-18 DIAGNOSIS — E1165 Type 2 diabetes mellitus with hyperglycemia: Secondary | ICD-10-CM | POA: Diagnosis not present

## 2022-05-18 DIAGNOSIS — R5383 Other fatigue: Secondary | ICD-10-CM | POA: Diagnosis not present

## 2022-05-25 DIAGNOSIS — I10 Essential (primary) hypertension: Secondary | ICD-10-CM | POA: Diagnosis not present

## 2022-05-25 DIAGNOSIS — G35 Multiple sclerosis: Secondary | ICD-10-CM | POA: Diagnosis not present

## 2022-05-25 DIAGNOSIS — G43909 Migraine, unspecified, not intractable, without status migrainosus: Secondary | ICD-10-CM | POA: Diagnosis not present

## 2022-05-25 DIAGNOSIS — E1165 Type 2 diabetes mellitus with hyperglycemia: Secondary | ICD-10-CM | POA: Diagnosis not present

## 2022-05-25 DIAGNOSIS — D735 Infarction of spleen: Secondary | ICD-10-CM | POA: Diagnosis not present

## 2022-05-25 DIAGNOSIS — M300 Polyarteritis nodosa: Secondary | ICD-10-CM | POA: Diagnosis not present

## 2022-05-25 DIAGNOSIS — F419 Anxiety disorder, unspecified: Secondary | ICD-10-CM | POA: Diagnosis not present

## 2022-05-25 DIAGNOSIS — E7849 Other hyperlipidemia: Secondary | ICD-10-CM | POA: Diagnosis not present

## 2022-05-25 DIAGNOSIS — F329 Major depressive disorder, single episode, unspecified: Secondary | ICD-10-CM | POA: Diagnosis not present

## 2022-07-15 ENCOUNTER — Other Ambulatory Visit: Payer: Self-pay | Admitting: *Deleted

## 2022-07-15 NOTE — Patient Outreach (Signed)
  Care Coordination   07/15/2022  Name: Amy Horton MRN: [id number] DOB: 20-Mar-1971   Care Coordination Outreach Attempts:  An unsuccessful telephone outreach was attempted today to offer the patient information about available care coordination services as a benefit of their health plan. HIPAA compliant message left on voicemail, providing contact information for CSW, encouraging patient to return CSW's call at her earliest convenience.  Follow Up Plan:  Additional outreach attempts will be made to offer the patient care coordination information and services.   Encounter Outcome:  No Answer.   Care Coordination Interventions:  No, not indicated.    Nat Christen, BSW, MSW, LCSW  Licensed Education officer, environmental Health System  Mailing [place] [address] [place], [place] [address] Physical Address[address] [place], [place] [address] Toll Free Main # [phone] Fax # [phone] Cell # [phone] [email or web address]

## 2022-07-19 ENCOUNTER — Other Ambulatory Visit: Payer: Self-pay | Admitting: *Deleted

## 2022-07-19 NOTE — Patient Outreach (Signed)
  Care Coordination   07/19/2022  Name: Amy Horton MRN: [id number] DOB: 05-Jun-1971   Care Coordination Outreach Attempts:  A second unsuccessful outreach was attempted today to offer the patient with information about available care coordination services as a benefit of their health plan.   HIPAA compliant message left on voicemail, providing contact information for CSW, encouraging patient to return CSW's call at her earliest convenience.  Follow Up Plan:  Additional outreach attempts will be made to offer the patient care coordination information and services.   Encounter Outcome:  No Answer.   Care Coordination Interventions:  No, not indicated.    Nat Christen, BSW, MSW, LCSW  Licensed Education officer, environmental Health System  Mailing [place] [address], [place], [place] [address] Physical Address[address], [place], [place] [address] Toll Free Main # [phone] Fax # [phone] Cell # [phone] [email or web address]

## 2022-07-22 ENCOUNTER — Other Ambulatory Visit: Payer: Self-pay | Admitting: *Deleted

## 2022-07-22 NOTE — Patient Outreach (Signed)
  Care Coordination   07/22/2022  Name: Amy Horton MRN: [id number] DOB: [birth year]   Care Coordination Outreach Attempts:  A third unsuccessful outreach was attempted today to offer the patient with information about available care coordination services as a benefit of their health plan. HIPAA compliant message left on voicemail, providing contact information for CSW, encouraging patient to return CSW's call at her earliest convenience.  Follow Up Plan:  No further outreach attempts will be made at this time. We have been unable to contact the patient to offer or enroll patient in care coordination services.  Encounter Outcome:  No Answer.   Care Coordination Interventions:  No, not indicated.    Nat Christen, BSW, MSW, LCSW  Licensed Education officer, environmental Health System  Mailing [place] [address] [place], [place] [address] Physical Address[address], [place], [place] [address] Toll Free Main # [phone] Fax # [phone] Cell # [phone] [email or web address]

## 2022-08-03 DIAGNOSIS — H52223 Regular astigmatism, bilateral: Secondary | ICD-10-CM | POA: Diagnosis not present

## 2022-08-03 DIAGNOSIS — H524 Presbyopia: Secondary | ICD-10-CM | POA: Diagnosis not present

## 2022-08-03 DIAGNOSIS — H5213 Myopia, bilateral: Secondary | ICD-10-CM | POA: Diagnosis not present

## 2022-08-03 DIAGNOSIS — H11002 Unspecified pterygium of left eye: Secondary | ICD-10-CM | POA: Diagnosis not present

## 2022-08-19 DIAGNOSIS — E7849 Other hyperlipidemia: Secondary | ICD-10-CM | POA: Diagnosis not present

## 2022-08-19 DIAGNOSIS — R7989 Other specified abnormal findings of blood chemistry: Secondary | ICD-10-CM | POA: Diagnosis not present

## 2022-08-19 DIAGNOSIS — R770 Abnormality of albumin: Secondary | ICD-10-CM | POA: Diagnosis not present

## 2022-08-19 DIAGNOSIS — R7309 Other abnormal glucose: Secondary | ICD-10-CM | POA: Diagnosis not present

## 2022-08-19 DIAGNOSIS — E782 Mixed hyperlipidemia: Secondary | ICD-10-CM | POA: Diagnosis not present

## 2022-08-19 DIAGNOSIS — I10 Essential (primary) hypertension: Secondary | ICD-10-CM | POA: Diagnosis not present

## 2022-08-19 DIAGNOSIS — E1165 Type 2 diabetes mellitus with hyperglycemia: Secondary | ICD-10-CM | POA: Diagnosis not present

## 2022-08-24 DIAGNOSIS — E559 Vitamin D deficiency, unspecified: Secondary | ICD-10-CM | POA: Diagnosis not present

## 2022-08-24 DIAGNOSIS — D735 Infarction of spleen: Secondary | ICD-10-CM | POA: Diagnosis not present

## 2022-08-24 DIAGNOSIS — R109 Unspecified abdominal pain: Secondary | ICD-10-CM | POA: Diagnosis not present

## 2022-08-24 DIAGNOSIS — E1165 Type 2 diabetes mellitus with hyperglycemia: Secondary | ICD-10-CM | POA: Diagnosis not present

## 2022-08-24 DIAGNOSIS — I712 Thoracic aortic aneurysm, without rupture, unspecified: Secondary | ICD-10-CM | POA: Diagnosis not present

## 2022-08-24 DIAGNOSIS — G35 Multiple sclerosis: Secondary | ICD-10-CM | POA: Diagnosis not present

## 2022-08-24 DIAGNOSIS — M300 Polyarteritis nodosa: Secondary | ICD-10-CM | POA: Diagnosis not present

## 2022-08-24 DIAGNOSIS — E7849 Other hyperlipidemia: Secondary | ICD-10-CM | POA: Diagnosis not present

## 2022-08-24 DIAGNOSIS — I10 Essential (primary) hypertension: Secondary | ICD-10-CM | POA: Diagnosis not present

## 2022-08-24 DIAGNOSIS — R7989 Other specified abnormal findings of blood chemistry: Secondary | ICD-10-CM | POA: Diagnosis not present

## 2022-08-24 DIAGNOSIS — R7309 Other abnormal glucose: Secondary | ICD-10-CM | POA: Diagnosis not present

## 2022-08-24 DIAGNOSIS — G43909 Migraine, unspecified, not intractable, without status migrainosus: Secondary | ICD-10-CM | POA: Diagnosis not present

## 2022-09-09 DIAGNOSIS — I700 Atherosclerosis of aorta: Secondary | ICD-10-CM | POA: Diagnosis not present

## 2022-09-09 DIAGNOSIS — K760 Fatty (change of) liver, not elsewhere classified: Secondary | ICD-10-CM | POA: Diagnosis not present

## 2022-09-09 DIAGNOSIS — J9811 Atelectasis: Secondary | ICD-10-CM | POA: Diagnosis not present

## 2022-09-09 DIAGNOSIS — K449 Diaphragmatic hernia without obstruction or gangrene: Secondary | ICD-10-CM | POA: Diagnosis not present

## 2022-09-09 DIAGNOSIS — I708 Atherosclerosis of other arteries: Secondary | ICD-10-CM | POA: Diagnosis not present

## 2022-09-09 DIAGNOSIS — I723 Aneurysm of iliac artery: Secondary | ICD-10-CM | POA: Diagnosis not present

## 2022-10-08 DIAGNOSIS — M300 Polyarteritis nodosa: Secondary | ICD-10-CM | POA: Diagnosis not present

## 2022-10-20 DIAGNOSIS — I10 Essential (primary) hypertension: Secondary | ICD-10-CM | POA: Diagnosis not present

## 2022-10-20 DIAGNOSIS — E782 Mixed hyperlipidemia: Secondary | ICD-10-CM | POA: Diagnosis not present

## 2022-10-20 DIAGNOSIS — E559 Vitamin D deficiency, unspecified: Secondary | ICD-10-CM | POA: Diagnosis not present

## 2022-10-20 DIAGNOSIS — E876 Hypokalemia: Secondary | ICD-10-CM | POA: Diagnosis not present

## 2022-10-20 DIAGNOSIS — E1165 Type 2 diabetes mellitus with hyperglycemia: Secondary | ICD-10-CM | POA: Diagnosis not present

## 2022-10-20 DIAGNOSIS — R7989 Other specified abnormal findings of blood chemistry: Secondary | ICD-10-CM | POA: Diagnosis not present

## 2022-10-20 DIAGNOSIS — R5383 Other fatigue: Secondary | ICD-10-CM | POA: Diagnosis not present

## 2022-10-26 DIAGNOSIS — Z6832 Body mass index (BMI) 32.0-32.9, adult: Secondary | ICD-10-CM | POA: Diagnosis not present

## 2022-10-26 DIAGNOSIS — H6691 Otitis media, unspecified, right ear: Secondary | ICD-10-CM | POA: Diagnosis not present

## 2022-10-26 DIAGNOSIS — H6990 Unspecified Eustachian tube disorder, unspecified ear: Secondary | ICD-10-CM | POA: Diagnosis not present

## 2022-10-26 DIAGNOSIS — R030 Elevated blood-pressure reading, without diagnosis of hypertension: Secondary | ICD-10-CM | POA: Diagnosis not present

## 2022-11-23 DIAGNOSIS — R7989 Other specified abnormal findings of blood chemistry: Secondary | ICD-10-CM | POA: Diagnosis not present

## 2022-11-23 DIAGNOSIS — F419 Anxiety disorder, unspecified: Secondary | ICD-10-CM | POA: Diagnosis not present

## 2022-11-23 DIAGNOSIS — D735 Infarction of spleen: Secondary | ICD-10-CM | POA: Diagnosis not present

## 2022-11-23 DIAGNOSIS — E7849 Other hyperlipidemia: Secondary | ICD-10-CM | POA: Diagnosis not present

## 2022-11-23 DIAGNOSIS — M300 Polyarteritis nodosa: Secondary | ICD-10-CM | POA: Diagnosis not present

## 2022-11-23 DIAGNOSIS — E782 Mixed hyperlipidemia: Secondary | ICD-10-CM | POA: Diagnosis not present

## 2022-11-23 DIAGNOSIS — R7309 Other abnormal glucose: Secondary | ICD-10-CM | POA: Diagnosis not present

## 2022-11-23 DIAGNOSIS — G35 Multiple sclerosis: Secondary | ICD-10-CM | POA: Diagnosis not present

## 2022-11-23 DIAGNOSIS — E1165 Type 2 diabetes mellitus with hyperglycemia: Secondary | ICD-10-CM | POA: Diagnosis not present

## 2022-11-23 DIAGNOSIS — I10 Essential (primary) hypertension: Secondary | ICD-10-CM | POA: Diagnosis not present

## 2022-11-23 DIAGNOSIS — G43909 Migraine, unspecified, not intractable, without status migrainosus: Secondary | ICD-10-CM | POA: Diagnosis not present

## 2022-11-23 DIAGNOSIS — Z0001 Encounter for general adult medical examination with abnormal findings: Secondary | ICD-10-CM | POA: Diagnosis not present

## 2022-12-01 DIAGNOSIS — G43909 Migraine, unspecified, not intractable, without status migrainosus: Secondary | ICD-10-CM | POA: Diagnosis not present

## 2022-12-01 DIAGNOSIS — I10 Essential (primary) hypertension: Secondary | ICD-10-CM | POA: Diagnosis not present

## 2023-02-26 DIAGNOSIS — Z6830 Body mass index (BMI) 30.0-30.9, adult: Secondary | ICD-10-CM | POA: Diagnosis not present

## 2023-02-26 DIAGNOSIS — R300 Dysuria: Secondary | ICD-10-CM | POA: Diagnosis not present

## 2023-02-26 DIAGNOSIS — N390 Urinary tract infection, site not specified: Secondary | ICD-10-CM | POA: Diagnosis not present

## 2023-02-26 DIAGNOSIS — R030 Elevated blood-pressure reading, without diagnosis of hypertension: Secondary | ICD-10-CM | POA: Diagnosis not present

## 2023-03-03 DIAGNOSIS — I10 Essential (primary) hypertension: Secondary | ICD-10-CM | POA: Diagnosis not present

## 2023-03-03 DIAGNOSIS — E7849 Other hyperlipidemia: Secondary | ICD-10-CM | POA: Diagnosis not present

## 2023-03-03 DIAGNOSIS — R5383 Other fatigue: Secondary | ICD-10-CM | POA: Diagnosis not present

## 2023-03-03 DIAGNOSIS — E876 Hypokalemia: Secondary | ICD-10-CM | POA: Diagnosis not present

## 2023-03-03 DIAGNOSIS — E1165 Type 2 diabetes mellitus with hyperglycemia: Secondary | ICD-10-CM | POA: Diagnosis not present

## 2023-03-07 DIAGNOSIS — R7309 Other abnormal glucose: Secondary | ICD-10-CM | POA: Diagnosis not present

## 2023-03-07 DIAGNOSIS — D735 Infarction of spleen: Secondary | ICD-10-CM | POA: Diagnosis not present

## 2023-03-07 DIAGNOSIS — I10 Essential (primary) hypertension: Secondary | ICD-10-CM | POA: Diagnosis not present

## 2023-03-07 DIAGNOSIS — G35 Multiple sclerosis: Secondary | ICD-10-CM | POA: Diagnosis not present

## 2023-03-07 DIAGNOSIS — R109 Unspecified abdominal pain: Secondary | ICD-10-CM | POA: Diagnosis not present

## 2023-03-07 DIAGNOSIS — E7849 Other hyperlipidemia: Secondary | ICD-10-CM | POA: Diagnosis not present

## 2023-03-07 DIAGNOSIS — E1165 Type 2 diabetes mellitus with hyperglycemia: Secondary | ICD-10-CM | POA: Diagnosis not present

## 2023-03-07 DIAGNOSIS — R7989 Other specified abnormal findings of blood chemistry: Secondary | ICD-10-CM | POA: Diagnosis not present

## 2023-03-07 DIAGNOSIS — E559 Vitamin D deficiency, unspecified: Secondary | ICD-10-CM | POA: Diagnosis not present

## 2023-03-07 DIAGNOSIS — F419 Anxiety disorder, unspecified: Secondary | ICD-10-CM | POA: Diagnosis not present

## 2023-03-07 DIAGNOSIS — M300 Polyarteritis nodosa: Secondary | ICD-10-CM | POA: Diagnosis not present

## 2023-04-19 DIAGNOSIS — E1165 Type 2 diabetes mellitus with hyperglycemia: Secondary | ICD-10-CM | POA: Diagnosis not present

## 2023-04-19 DIAGNOSIS — G35 Multiple sclerosis: Secondary | ICD-10-CM | POA: Diagnosis not present

## 2023-04-19 DIAGNOSIS — M300 Polyarteritis nodosa: Secondary | ICD-10-CM | POA: Diagnosis not present

## 2023-04-19 DIAGNOSIS — E559 Vitamin D deficiency, unspecified: Secondary | ICD-10-CM | POA: Diagnosis not present

## 2023-04-19 DIAGNOSIS — R7309 Other abnormal glucose: Secondary | ICD-10-CM | POA: Diagnosis not present

## 2023-04-19 DIAGNOSIS — D735 Infarction of spleen: Secondary | ICD-10-CM | POA: Diagnosis not present

## 2023-04-19 DIAGNOSIS — F419 Anxiety disorder, unspecified: Secondary | ICD-10-CM | POA: Diagnosis not present

## 2023-04-19 DIAGNOSIS — E7849 Other hyperlipidemia: Secondary | ICD-10-CM | POA: Diagnosis not present

## 2023-04-19 DIAGNOSIS — I10 Essential (primary) hypertension: Secondary | ICD-10-CM | POA: Diagnosis not present

## 2023-04-19 DIAGNOSIS — R7989 Other specified abnormal findings of blood chemistry: Secondary | ICD-10-CM | POA: Diagnosis not present

## 2023-06-10 DIAGNOSIS — R7989 Other specified abnormal findings of blood chemistry: Secondary | ICD-10-CM | POA: Diagnosis not present

## 2023-06-10 DIAGNOSIS — I10 Essential (primary) hypertension: Secondary | ICD-10-CM | POA: Diagnosis not present

## 2023-06-10 DIAGNOSIS — E7849 Other hyperlipidemia: Secondary | ICD-10-CM | POA: Diagnosis not present

## 2023-06-10 DIAGNOSIS — E1165 Type 2 diabetes mellitus with hyperglycemia: Secondary | ICD-10-CM | POA: Diagnosis not present

## 2023-06-10 DIAGNOSIS — E876 Hypokalemia: Secondary | ICD-10-CM | POA: Diagnosis not present

## 2023-06-10 DIAGNOSIS — R5383 Other fatigue: Secondary | ICD-10-CM | POA: Diagnosis not present

## 2023-06-15 DIAGNOSIS — E1165 Type 2 diabetes mellitus with hyperglycemia: Secondary | ICD-10-CM | POA: Diagnosis not present

## 2023-06-15 DIAGNOSIS — F419 Anxiety disorder, unspecified: Secondary | ICD-10-CM | POA: Diagnosis not present

## 2023-06-15 DIAGNOSIS — M300 Polyarteritis nodosa: Secondary | ICD-10-CM | POA: Diagnosis not present

## 2023-06-15 DIAGNOSIS — E559 Vitamin D deficiency, unspecified: Secondary | ICD-10-CM | POA: Diagnosis not present

## 2023-06-15 DIAGNOSIS — D735 Infarction of spleen: Secondary | ICD-10-CM | POA: Diagnosis not present

## 2023-06-15 DIAGNOSIS — G43909 Migraine, unspecified, not intractable, without status migrainosus: Secondary | ICD-10-CM | POA: Diagnosis not present

## 2023-06-15 DIAGNOSIS — E7849 Other hyperlipidemia: Secondary | ICD-10-CM | POA: Diagnosis not present

## 2023-06-15 DIAGNOSIS — R7989 Other specified abnormal findings of blood chemistry: Secondary | ICD-10-CM | POA: Diagnosis not present

## 2023-06-15 DIAGNOSIS — R7309 Other abnormal glucose: Secondary | ICD-10-CM | POA: Diagnosis not present

## 2023-06-15 DIAGNOSIS — I10 Essential (primary) hypertension: Secondary | ICD-10-CM | POA: Diagnosis not present

## 2023-08-06 DIAGNOSIS — Z0100 Encounter for examination of eyes and vision without abnormal findings: Secondary | ICD-10-CM | POA: Diagnosis not present

## 2023-08-08 DIAGNOSIS — H5213 Myopia, bilateral: Secondary | ICD-10-CM | POA: Diagnosis not present

## 2023-08-15 DIAGNOSIS — J019 Acute sinusitis, unspecified: Secondary | ICD-10-CM | POA: Diagnosis not present

## 2023-08-15 DIAGNOSIS — R059 Cough, unspecified: Secondary | ICD-10-CM | POA: Diagnosis not present

## 2023-08-15 DIAGNOSIS — Z20828 Contact with and (suspected) exposure to other viral communicable diseases: Secondary | ICD-10-CM | POA: Diagnosis not present

## 2023-08-15 DIAGNOSIS — Z6829 Body mass index (BMI) 29.0-29.9, adult: Secondary | ICD-10-CM | POA: Diagnosis not present

## 2023-08-15 DIAGNOSIS — J029 Acute pharyngitis, unspecified: Secondary | ICD-10-CM | POA: Diagnosis not present

## 2023-09-02 DIAGNOSIS — E1165 Type 2 diabetes mellitus with hyperglycemia: Secondary | ICD-10-CM | POA: Diagnosis not present

## 2023-09-02 DIAGNOSIS — E7849 Other hyperlipidemia: Secondary | ICD-10-CM | POA: Diagnosis not present

## 2023-09-02 DIAGNOSIS — E782 Mixed hyperlipidemia: Secondary | ICD-10-CM | POA: Diagnosis not present

## 2023-09-02 DIAGNOSIS — E876 Hypokalemia: Secondary | ICD-10-CM | POA: Diagnosis not present

## 2023-09-02 DIAGNOSIS — R5383 Other fatigue: Secondary | ICD-10-CM | POA: Diagnosis not present

## 2023-10-13 DIAGNOSIS — J09X2 Influenza due to identified novel influenza A virus with other respiratory manifestations: Secondary | ICD-10-CM | POA: Diagnosis not present

## 2023-10-13 DIAGNOSIS — Z2089 Contact with and (suspected) exposure to other communicable diseases: Secondary | ICD-10-CM | POA: Diagnosis not present

## 2023-10-13 DIAGNOSIS — Z20822 Contact with and (suspected) exposure to covid-19: Secondary | ICD-10-CM | POA: Diagnosis not present

## 2023-10-13 DIAGNOSIS — Z6829 Body mass index (BMI) 29.0-29.9, adult: Secondary | ICD-10-CM | POA: Diagnosis not present

## 2023-10-13 DIAGNOSIS — Z20828 Contact with and (suspected) exposure to other viral communicable diseases: Secondary | ICD-10-CM | POA: Diagnosis not present

## 2023-11-01 DIAGNOSIS — R300 Dysuria: Secondary | ICD-10-CM | POA: Diagnosis not present

## 2023-11-01 DIAGNOSIS — Z6830 Body mass index (BMI) 30.0-30.9, adult: Secondary | ICD-10-CM | POA: Diagnosis not present

## 2023-11-16 DIAGNOSIS — Z6830 Body mass index (BMI) 30.0-30.9, adult: Secondary | ICD-10-CM | POA: Diagnosis not present

## 2023-11-16 DIAGNOSIS — L509 Urticaria, unspecified: Secondary | ICD-10-CM | POA: Diagnosis not present

## 2023-12-06 DIAGNOSIS — Z1331 Encounter for screening for depression: Secondary | ICD-10-CM | POA: Diagnosis not present

## 2023-12-06 DIAGNOSIS — R748 Abnormal levels of other serum enzymes: Secondary | ICD-10-CM | POA: Diagnosis not present

## 2023-12-06 DIAGNOSIS — Z0001 Encounter for general adult medical examination with abnormal findings: Secondary | ICD-10-CM | POA: Diagnosis not present

## 2023-12-06 DIAGNOSIS — R1084 Generalized abdominal pain: Secondary | ICD-10-CM | POA: Diagnosis not present

## 2023-12-06 DIAGNOSIS — Z1389 Encounter for screening for other disorder: Secondary | ICD-10-CM | POA: Diagnosis not present

## 2023-12-06 DIAGNOSIS — I10 Essential (primary) hypertension: Secondary | ICD-10-CM | POA: Diagnosis not present

## 2023-12-06 DIAGNOSIS — M300 Polyarteritis nodosa: Secondary | ICD-10-CM | POA: Diagnosis not present

## 2023-12-06 DIAGNOSIS — Z6830 Body mass index (BMI) 30.0-30.9, adult: Secondary | ICD-10-CM | POA: Diagnosis not present

## 2024-01-11 DIAGNOSIS — E876 Hypokalemia: Secondary | ICD-10-CM | POA: Diagnosis not present

## 2024-01-11 DIAGNOSIS — E782 Mixed hyperlipidemia: Secondary | ICD-10-CM | POA: Diagnosis not present

## 2024-01-11 DIAGNOSIS — E7849 Other hyperlipidemia: Secondary | ICD-10-CM | POA: Diagnosis not present

## 2024-01-11 DIAGNOSIS — R748 Abnormal levels of other serum enzymes: Secondary | ICD-10-CM | POA: Diagnosis not present

## 2024-01-11 DIAGNOSIS — I10 Essential (primary) hypertension: Secondary | ICD-10-CM | POA: Diagnosis not present

## 2024-01-11 DIAGNOSIS — E1165 Type 2 diabetes mellitus with hyperglycemia: Secondary | ICD-10-CM | POA: Diagnosis not present

## 2024-01-16 DIAGNOSIS — R748 Abnormal levels of other serum enzymes: Secondary | ICD-10-CM | POA: Diagnosis not present

## 2024-01-16 DIAGNOSIS — Z6828 Body mass index (BMI) 28.0-28.9, adult: Secondary | ICD-10-CM | POA: Diagnosis not present

## 2024-01-16 DIAGNOSIS — I10 Essential (primary) hypertension: Secondary | ICD-10-CM | POA: Diagnosis not present

## 2024-01-16 DIAGNOSIS — R1084 Generalized abdominal pain: Secondary | ICD-10-CM | POA: Diagnosis not present

## 2024-01-16 DIAGNOSIS — M300 Polyarteritis nodosa: Secondary | ICD-10-CM | POA: Diagnosis not present

## 2024-02-08 DIAGNOSIS — Z6827 Body mass index (BMI) 27.0-27.9, adult: Secondary | ICD-10-CM | POA: Diagnosis not present

## 2024-02-08 DIAGNOSIS — R748 Abnormal levels of other serum enzymes: Secondary | ICD-10-CM | POA: Diagnosis not present

## 2024-02-08 DIAGNOSIS — R1084 Generalized abdominal pain: Secondary | ICD-10-CM | POA: Diagnosis not present

## 2024-02-08 DIAGNOSIS — I10 Essential (primary) hypertension: Secondary | ICD-10-CM | POA: Diagnosis not present

## 2024-02-08 DIAGNOSIS — M300 Polyarteritis nodosa: Secondary | ICD-10-CM | POA: Diagnosis not present

## 2024-02-10 DIAGNOSIS — R519 Headache, unspecified: Secondary | ICD-10-CM | POA: Diagnosis not present

## 2024-02-10 DIAGNOSIS — G43009 Migraine without aura, not intractable, without status migrainosus: Secondary | ICD-10-CM | POA: Diagnosis not present

## 2024-02-10 DIAGNOSIS — Z6827 Body mass index (BMI) 27.0-27.9, adult: Secondary | ICD-10-CM | POA: Diagnosis not present

## 2024-02-10 DIAGNOSIS — H608X1 Other otitis externa, right ear: Secondary | ICD-10-CM | POA: Diagnosis not present

## 2024-03-02 ENCOUNTER — Encounter: Payer: Self-pay | Admitting: Advanced Practice Midwife

## 2024-04-13 DIAGNOSIS — E440 Moderate protein-calorie malnutrition: Secondary | ICD-10-CM | POA: Diagnosis not present

## 2024-04-13 DIAGNOSIS — Z885 Allergy status to narcotic agent status: Secondary | ICD-10-CM | POA: Diagnosis not present

## 2024-04-13 DIAGNOSIS — Z118 Encounter for screening for other infectious and parasitic diseases: Secondary | ICD-10-CM | POA: Diagnosis not present

## 2024-04-13 DIAGNOSIS — E785 Hyperlipidemia, unspecified: Secondary | ICD-10-CM | POA: Diagnosis not present

## 2024-04-13 DIAGNOSIS — R1013 Epigastric pain: Secondary | ICD-10-CM | POA: Diagnosis not present

## 2024-04-13 DIAGNOSIS — I808 Phlebitis and thrombophlebitis of other sites: Secondary | ICD-10-CM | POA: Diagnosis not present

## 2024-04-13 DIAGNOSIS — I7779 Dissection of other artery: Secondary | ICD-10-CM | POA: Diagnosis not present

## 2024-04-13 DIAGNOSIS — Z7952 Long term (current) use of systemic steroids: Secondary | ICD-10-CM | POA: Diagnosis not present

## 2024-04-13 DIAGNOSIS — I77810 Thoracic aortic ectasia: Secondary | ICD-10-CM | POA: Diagnosis not present

## 2024-04-13 DIAGNOSIS — R519 Headache, unspecified: Secondary | ICD-10-CM | POA: Diagnosis not present

## 2024-04-13 DIAGNOSIS — K30 Functional dyspepsia: Secondary | ICD-10-CM | POA: Diagnosis not present

## 2024-04-13 DIAGNOSIS — M50322 Other cervical disc degeneration at C5-C6 level: Secondary | ICD-10-CM | POA: Diagnosis not present

## 2024-04-13 DIAGNOSIS — G43709 Chronic migraine without aura, not intractable, without status migrainosus: Secondary | ICD-10-CM | POA: Diagnosis not present

## 2024-04-13 DIAGNOSIS — I7773 Dissection of renal artery: Secondary | ICD-10-CM | POA: Diagnosis not present

## 2024-04-13 DIAGNOSIS — K5900 Constipation, unspecified: Secondary | ICD-10-CM | POA: Diagnosis not present

## 2024-04-13 DIAGNOSIS — I517 Cardiomegaly: Secondary | ICD-10-CM | POA: Diagnosis not present

## 2024-04-13 DIAGNOSIS — G9529 Other cord compression: Secondary | ICD-10-CM | POA: Diagnosis not present

## 2024-04-13 DIAGNOSIS — K5903 Drug induced constipation: Secondary | ICD-10-CM | POA: Diagnosis not present

## 2024-04-13 DIAGNOSIS — Z1159 Encounter for screening for other viral diseases: Secondary | ICD-10-CM | POA: Diagnosis not present

## 2024-04-13 DIAGNOSIS — T182XXA Foreign body in stomach, initial encounter: Secondary | ICD-10-CM | POA: Diagnosis not present

## 2024-04-13 DIAGNOSIS — K2289 Other specified disease of esophagus: Secondary | ICD-10-CM | POA: Diagnosis not present

## 2024-04-13 DIAGNOSIS — G43009 Migraine without aura, not intractable, without status migrainosus: Secondary | ICD-10-CM | POA: Diagnosis not present

## 2024-04-13 DIAGNOSIS — K3184 Gastroparesis: Secondary | ICD-10-CM | POA: Diagnosis not present

## 2024-04-13 DIAGNOSIS — I999 Unspecified disorder of circulatory system: Secondary | ICD-10-CM | POA: Diagnosis not present

## 2024-04-13 DIAGNOSIS — E538 Deficiency of other specified B group vitamins: Secondary | ICD-10-CM | POA: Diagnosis not present

## 2024-04-13 DIAGNOSIS — F40240 Claustrophobia: Secondary | ICD-10-CM | POA: Diagnosis not present

## 2024-04-13 DIAGNOSIS — Z934 Other artificial openings of gastrointestinal tract status: Secondary | ICD-10-CM | POA: Diagnosis not present

## 2024-04-13 DIAGNOSIS — I7772 Dissection of iliac artery: Secondary | ICD-10-CM | POA: Diagnosis not present

## 2024-04-13 DIAGNOSIS — R110 Nausea: Secondary | ICD-10-CM | POA: Diagnosis not present

## 2024-04-13 DIAGNOSIS — Z4659 Encounter for fitting and adjustment of other gastrointestinal appliance and device: Secondary | ICD-10-CM | POA: Diagnosis not present

## 2024-04-13 DIAGNOSIS — E876 Hypokalemia: Secondary | ICD-10-CM | POA: Diagnosis not present

## 2024-04-13 DIAGNOSIS — R638 Other symptoms and signs concerning food and fluid intake: Secondary | ICD-10-CM | POA: Diagnosis not present

## 2024-04-13 DIAGNOSIS — K2950 Unspecified chronic gastritis without bleeding: Secondary | ICD-10-CM | POA: Diagnosis not present

## 2024-04-13 DIAGNOSIS — I7121 Aneurysm of the ascending aorta, without rupture: Secondary | ICD-10-CM | POA: Diagnosis not present

## 2024-04-13 DIAGNOSIS — Z7289 Other problems related to lifestyle: Secondary | ICD-10-CM | POA: Diagnosis not present

## 2024-04-13 DIAGNOSIS — R7303 Prediabetes: Secondary | ICD-10-CM | POA: Diagnosis not present

## 2024-04-13 DIAGNOSIS — M300 Polyarteritis nodosa: Secondary | ICD-10-CM | POA: Diagnosis not present

## 2024-04-13 DIAGNOSIS — G35 Multiple sclerosis: Secondary | ICD-10-CM | POA: Diagnosis not present

## 2024-04-13 DIAGNOSIS — E782 Mixed hyperlipidemia: Secondary | ICD-10-CM | POA: Diagnosis not present

## 2024-04-13 DIAGNOSIS — Z79899 Other long term (current) drug therapy: Secondary | ICD-10-CM | POA: Diagnosis not present

## 2024-04-13 DIAGNOSIS — R1084 Generalized abdominal pain: Secondary | ICD-10-CM | POA: Diagnosis not present

## 2024-04-15 DIAGNOSIS — M300 Polyarteritis nodosa: Secondary | ICD-10-CM | POA: Diagnosis not present

## 2024-04-16 DIAGNOSIS — M300 Polyarteritis nodosa: Secondary | ICD-10-CM | POA: Diagnosis not present

## 2024-04-17 DIAGNOSIS — R110 Nausea: Secondary | ICD-10-CM | POA: Diagnosis not present

## 2024-04-18 DIAGNOSIS — M300 Polyarteritis nodosa: Secondary | ICD-10-CM | POA: Diagnosis not present

## 2024-04-20 NOTE — Discharge Summary (Signed)
 [facility] Medicine Discharge Summary  Admit Date: 04/13/2024 Discharge Date: 04/30/2024  Admitting Physician: Garrison Bolognese, MD Discharge Physician: Diona Nyhan, MD  Primary Care Provider: Jeanette Comer Norris, [place], Phone [phone]  Discharge Destination: Home with home health:    Admission Diagnoses:  C/f polyarteritis nodosa flare  Discharge Diagnoses:  Principal Problem:   Abdominal pain Active Problems:   Anxiety disorder   Multiple sclerosis (CMS/HHS-HCC)   PAN (polyarteritis nodosa) (CMS/HHS-HCC)   Dissection of left renal artery (CMS/HHS-HCC)   Ascending aorta dilatation ()   Postprandial epigastric pain   Vasculopathy   Thoracic aortic aneurysm ()   Chronic migraine without aura   Hyperlipidemia   Prediabetes   Anemia, iron deficiency   B12 deficiency   Thrombophlebitis arm   Inadequate oral intake   Nondiabetic gastroparesis   Moderate protein-calorie malnutrition (CMS/HHS-HCC)   Headache disorder   Bulging of cervical intervertebral disc Resolved Problems:   Dyspepsia   Constipation  Primary Diagnosis: Admitted for abdominal pain found to have gastroparesis, started on tube feeds    Changes Made (with rationale):  GJ tube placed, J port Nutren 1.5 95 mL/hr x 14h s  Meds changed per med rec  To-Do List (incidental findings, follow-up studies, etc.): Continued outpatient monitoring of vascular pathology  GI f/u for gastroparesis evaluation    Anticipatory Guidance for Outpatient Care:  F/u with vascular surgery outpatient for stable dissection into left external iliac artery     Results Pending at Discharge:  None Please see phone numbers at end of this summary for lab contact information.   Follow-up/Care Transition Plan: Sched. appts: Future Appointments  Date Time Provider Department Center  06/06/2024  3:30 PM Criscione-Schreiber, Olam, MD [facility] [place]  07/16/2024  3:00 PM CC CT 5 [facility]CT [facility]   07/26/2024  2:45 PM Southerland, Franky Fallow, MD [facility]  07/30/2024 12:00 PM [facility] CARD MRI 1 [facility] CARD MRI [facility]  07/30/2024  2:30 PM Vinita Zachary Carlin MADISON, MD 2F2G [facility]    Follow-up info:  Vinita Zachary Carlin MADISON, MD [address] [name] [address] [phone]  Follow up Dr. Vinita' office will reach out to you to set up an appointment in 3 months  [facility] [address] [place]  [address] [phone]        Allergies/Intolerances:  Allergies  Allergen Reactions  . Codeine Sulfate Rash  . Codeine Nausea     New Adverse Drug Events: none  Medications:     Current Discharge Medication List     START taking these medications      Instructions  cyanocobalamin 1000 MCG tablet Quantity: 30 tablet Refills: 0 Start taking on: May 01, 2024  Commonly known as: VITAMIN B12 Take 1 tablet (1,000 mcg total) by mouth once daily for 30 days Last time this was given: 1,000 mcg on April 30, 2024  8:30 AM   enteral syringe, non-sterile Quantity: 50 each Refills: 0  Commonly known as: ENFIT 1 each as directed Pharmacy to dispense 2 ENFit syringes with appropriate bottle adapter(s) per medication based on the following size(s): 60 ml syringes for med(s) Doctor's comments: Please dispense syringe number and size according to dose and therapy   losartan 25 MG tablet Quantity: 30 tablet Refills: 0 Start taking on: May 01, 2024  Commonly known as: COZAAR Take 1 tablet (25 mg total) by mouth once daily Last time this was given: 25 mg  on April 30, 2024  8:30 AM   magnesium oxide 400 mg (241.3 mg magnesium) tablet Quantity: 30 tablet Refills: 0  Commonly known as: MAG-OX Take 1 tablet (400 mg total) by mouth once daily Last time this was given: 400 mg on April 30, 2024  8:30 AM   multivitamin-minerals-FA-lutein 0.4 mg-300 mcg- 250 mcg tablet Quantity: 30  tablet Refills: 0 Start taking on: May 01, 2024  Commonly known as: CENTRUM SILVER Take 1 tablet by mouth once daily Last time this was given: 1 tablet on April 30, 2024  8:30 AM   rosuvastatin 10 MG tablet Quantity: 30 tablet Refills: 0  Commonly known as: CRESTOR Take 1 tablet (10 mg total) by mouth at bedtime Last time this was given: 10 mg on April 29, 2024  9:48 PM   thiamine 100 MG tablet Quantity: 30 tablet Refills: 0 Start taking on: May 01, 2024  Commonly known as: Vitamin B-1 Take 1 tablet (100 mg total) by mouth once daily Last time this was given: 100 mg on April 30, 2024  8:30 AM       These medications have been CHANGED      Instructions  topiramate  50 MG tablet Quantity: 60 tablet Refills: 0 What changed: when to take this  Commonly known as: TOPAMAX  Take 1 tablet (50 mg total) by mouth nightly Last time this was given: 50 mg on April 30, 2024  8:29 AM       CONTINUE taking these medications      Instructions  acyclovir 400 MG tablet Refills: 0  Commonly known as: ZOVIRAX Take 400 mg by mouth once daily as needed   ALPRAZolam  0.5 MG tablet Refills: 0  Commonly known as: XANAX  Take 0.5 mg by mouth 2 (two) times daily as needed Last time this was given: 0.5 mg on April 30, 2024  8:49 AM       STOP taking these medications    ibuprofen 200 MG tablet Commonly known as: MOTRIN         Brief History of Present Illness: from HPI 53 y.o. 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   Per patient, she has had worsening epigastric abdominal pain that radiates to her  back for the last 3 months. This pain is very smilar to the pain she had when she was initially diagnosed with PAN. She states that it is worse with eating, and has progressed such that even liquids and yogurt will cause her pain. She has lost about ~20lb in the last 3 months.   For these symptoms she saw her outpatient rheumatologist Dr Artist today on 8/29, who ordered stat abdominal CTA. This returned with new short segment L renal artery dissection as well as thoracic aorta enlargement (incompletely visualized, at least 4.5 cm in maximal diameter), concerning for PAN flare. She was direct admitted to clinic for further evaluation of this _____________________   Hospital Course by Problem:   # Postprandial abdominal Pain likely secondary to Nondiabetic Gastroparesis Dx of gastroparesis on emptying study on 9/4. Has completely resolved with post-pyloric feeds. #Moderate protein calorie malnutrition on tube feeding, postpyloric # Hypokalemia, resolved # Multiple sclerosis in remission neurology consutled, felt not active right now.  There is documented association with autonomic dysfunction including gastroparesis. Dx of gastroparesis on emptying study on 9/4. Pain completely resolved with post-pyloric feeds. Prior to this diagnosis underwent extensive evaluation for other etiologies of abdominal pain (including PAN w/rheumatology and vascular surgery [thought not to be driving the process with stable vascular changes] as well as getting an EGD w/only mild gastritis). Neuro consulted for evaluation of cause of gastroparesis in case this is an atypical presentation of MS and MRI brain w/o etiology. Other ddx inc idiopathic, postviral, mesenteric ischemia; less likely autoimmune or paraneoplastic as no obvious solid lesion on extensive CT imaging.  Pain has completely resolved with starting post pyloric tube feeds. Underwent GJ tube placement with IR and then d/c'd with tube feeds at Nutren  1.5 start at goal of 95 mL/hr x 14h  via J tube. Next steps in additional evaluation of manometry and consideration of electrical stimulation would happen as outpatient. GI referral placed as outpatient. She was treated with opioids for pain this admission, but did not need any at d/c. She had electrolytes supplemented this admission. Did not need any supplementation at d/c.    # Polyarteritis nodosum s/p celiac artery stent, splenic artery embolization diagnosed 2019 Primary symptom is pain after eating, solids not liquids. See above, suspect gastroparesis contributing. DDx has initially been vasculopathy/ vasculitis (PAN, FMD, CMI) as well as dyspepsia from PUD. Initial eval not convincing for the former and EGD on 9/2 not diagnostic for any PUD and bx without H pylori or active gastritis. Considered MALS, not seen on CTAP. Repeat CT AAA on 9/3 with stable dissection changes. Need close outpatient f/u for  this and below vascular pathology. Rheum: No indication for immunosuppression  # Dissection of L renal artery, stable CT shows stable dissection into left external iliac artery #Thoracic aortic aneurism 4.8 cm # Hyperlipidemia Dissection and aneurysm noted on imaging work up for abdominal pain. CT surg and Vascular have seen her; outpatient f/u will be arranged. CTA with good delineation of vessels so MRA chest/abd/pelvis with less utility per radiology. CTA head/neck reassuring. Likely related to PAN but not active given down trending inflammatory markers and stable on repeat imaging. Recommended the following: - ASA 81; added statin (crestor 10) - repeat lipids in 3 months as outpatient - continue outpatient vascular surgery, ct surgery and rheumatology follow up as outpatient.  - referral placed to medical genetics to evaluate for genetic cause of aortic aneursym.    #Acute on Chronic Migraine Continue topimax. CTM closely for any new neurologic sx. CT, CTA and MRI recently performed without  acute changes. Treated with fioricet   #Thrombophlebitis, L arm LUE noted 9/2 in area of infiltrated PIV. Tender and warm but  not erythematous. Firm cord clearly superficial thrombophlebitis. Picc removed prior to d/c   # Iron deficiency anemia resolved # Vit B12 deficiency  B12 low normal. Iron low, IV iron 1g given Started on B12 supplementation   #Anxiety Continue home xanax    #Prediabetes A1c of 5.7, f/u pcp  # Essential hypertension BP 130s~145/90. Start low-dose losartan 25 mg   Malnutrition: She has been diagnosed with moderate protein-calorie malnutrition in the context of chronic illness, based on energy intake meeting < or equal to 75% of estimated requirement for > or equal to 1 month, moderate subcutaneous fat loss, moderate muscle mass loss.  - Recommend oral nutrition, Recommend oral nutrition supplements, Recommend trend weight status, Recommend vitamin/mineral supplementation.    Surgeries and Procedures Performed:  Procedure(s): Upper Endoscopy (EGD) GJ tube placement _____________________  Discharge Exam:  BP 138/85 (BP Location: Left upper arm, Patient Position: Lying)   Pulse 57   Temp 36.4 C (97.5 F) (Oral)   Resp 17   Ht 165.1 cm (5' 5)   Wt 70.4 kg (155 lb 3.3 oz)   SpO2 100%   BMI 25.83 kg/m  O2 Device: Nasal cannula (04/25/24 0809)  General: alert, cooperative, in NAD Eyes: conjunctiva clear, anicteric sclera HENT: normocephalic, atraumatic, moist mucous membranes Neck: supple, symmetrical, trachea midline CV: regular rate and rhythm, without murmurs, rubs or gallops Resp: clear to auscultation, no wheezing, rhonchi Abd: soft, nontender, nondistended, normoactive bowel sounds  Ext: no lower extremity edema Skin: warm and dry Psych: oriented to time, place and person, mood and affect are appropriate Neuro: CN II-XII intact. Grossly normal and symmetric strength in upper and lower extremities.    Pertinent Lab Testing: Recent Labs   Lab 04/26/24 0531 04/28/24 0541 04/29/24 0606  NA 140 140 139  K 3.5 3.5 3.5  CL 106 103 107  CO2 23 22 22   BUN 19 21* 22*  CREATININE 0.8 0.7 0.8  GLUCOSE 149* 163* 175*  CALCIUM 9.1 9.3 9.1   Recent Labs  Lab 04/25/24 0520  AST 23  ALT 27  ALKPHOS 86  TBILI 0.6    Recent Labs  Lab 04/25/24 0520 04/26/24 0531 04/28/24 0541  WBC 9.4 10.6* 8.6  HGB 13.5 12.8 12.7  HCT 40.5 39.3 38.3  PLT 211 210 205   Recent Labs  Lab 04/25/24 0520  INR 1.1     Other Pertinent Labs:   Recent Labs  Lab 04/29/24 0606  CRP 0.76    Last Lipids: Lab Results  Component Value Date   CHOLTOTAL 178 04/14/2024   LDLCALC 123 (H) 04/14/2024   HDL 33 04/14/2024   TRIG 108 04/14/2024   Lab Results  Component Value Date   HGBA1C 5.7 (H) 04/14/2024    Lab Results  Component Value Date   IRON 27 (L) 04/15/2024   TIBC 288 04/15/2024   FERRITIN 134 06/19/2018    Most recent vitamin B12 value Lab Results  Component Value Date   VITB12 192 04/15/2024    Pertinent Imaging:   9/7 MRI spine: IMPRESSION:    1. Unchanged broad-based disc bulge at C5-6 which produces severe spinal cord compression; no findings to suggest demyelinating disease  9/5 MRI brain: Stable white matter lesions compatible with multiple sclerosis.  No evidence of active demyelination.    9/4 Gastric emptying study: Solid gastric emptying at 59 minutes is 26% (normal range at 60 minutes is >10%). Solid gastric emptying at 118 minutes is 35% (normal range at 120 minutes is >40%). Solid gastric emptying at 240 minutes is 69% (normal range at 240 minutes is >90%). Delayed solid gastric emptying study.    9/3 CT AAA: 1.  Stable dissection of left common iliac artery extending into the left external iliac artery. 2.  Similar appearance of the celiac artery stent with unchanged subsegmental dissection near the celiac trifurcation. No change in caliber of the celiac artery or its branches. Persistent  therapeutically occluded splenic artery. 3.  Unchanged appearance of short segment dissection of the left mid renal artery.     8/31 CTA head/neck 1. Evaluation of the aortic arch and neck base is limited by motion and adjacent contrast. 2. No acute intracranial abnormality.  3. No intracranial large vessel occlusion or aneurysm.  4. No hemodynamically  significant arterial stenosis in the neck.    5. No CTA evidence of vasculitis in the head or neck.   8/29 CT chest: 1. No evidence of intramural hematoma or dissection of the thoracic aorta.  2. Aneurysmal dilation of the ascending thoracic aorta up to 4.8 cm.   8/29 CT Abd: 1.  Stable appearance of celiac artery stent with unchanged short segment residual dissection near the celiac trifurcation. No change in celiac or branch artery caliber. Persistent therapeutically occluded splenic artery. 2.  Interval appearance of a short segment dissection involving the left mid renal artery, likely secondary to the patient's underlying arteriopathy. The left renal artery remains patent, and the left kidney remains normal in size without evidence of focal perfusion defect. 3.  Incompletely visualized dilated ascending thoracic aorta, at least 4.5 cm maximal diameter. Would suggest dedicated CTA chest for further analysis. _____________________  Code Status: Full Code   Status on Discharge:  Current activity: Walks occasionally (04/29/24 2148) Current mobility: No limitation (04/29/24 2148)  Activity Recommendation: activity as tolerated  Other Discharge Instructions: Services setup at discharge: Home Health Nurse and tube feeding Tubes/lines at discharge: GJ tube  Diet: tube feeding (NUTREN 1.5) liquid Diet po as tolerates   Wound Care Order Instructions     None       _____________________  Time spent on discharge process: 45 minutes    NELLIE CASSIS, MD PhD  [facility]  04/30/2024   Hospital Contact Information:   [facility]) [facility]) [facility])  Pending tests:  Laboratory: [phone] Microbiology: [phone] Pathology: [phone] Radiology: [phone]  General questions: [phone] Pending tests: Laboratory: [phone] Microbiology: [phone] Pathology: [phone] Radiology: [phone]  General questions:  [phone] Pending tests:  Laboratory: [phone] Microbiology: [phone] Pathology: [phone] Radiology: [phone]  General questions:  [phone]

## 2024-04-29 DIAGNOSIS — R1013 Epigastric pain: Secondary | ICD-10-CM | POA: Diagnosis not present

## 2024-04-29 DIAGNOSIS — R1084 Generalized abdominal pain: Secondary | ICD-10-CM | POA: Diagnosis not present

## 2024-04-29 DIAGNOSIS — Z934 Other artificial openings of gastrointestinal tract status: Secondary | ICD-10-CM | POA: Diagnosis not present

## 2024-04-29 DIAGNOSIS — K3184 Gastroparesis: Secondary | ICD-10-CM | POA: Diagnosis not present

## 2024-04-30 NOTE — Progress Notes (Signed)
 Case Manager Discharge Summary / Closing Note  Expected Discharge Date & Time: 04/30/2024 at 2 pm to 5 pm  Discharge Plan:     (pt to dc home)  Post-Acute Services Coordinated: Destination - Admitted Since 04/13/2024   No services have been selected for the patient.      Disposition:      Home Medical Care - Admitted Since 04/13/2024     Service Provider Services Address Phone Fax Patient Preferred   [facility] [address], [name] [address] [phone] --       Home Health    Flowsheet Row Most Recent Value  Agency Info / [place] Phone [facility] [phone] per [facility] Arranged Nursing  Start of Care 05/02/24 [phone] hours]  Nursing Services tube feeding  Information Provided This agency has Epic / Medlink access and has agreed to retrieve the order and necessary supporting documentation independently   Durable Medical Equipment - Admitted Since 04/13/2024     Service Provider Services Address Phone Fax Patient Preferred   [facility] [address] [place], [name] [address] [phone] --       DME Documentation    Flowsheet Row Most Recent Value  Agency Name [employer] [facility] [employer]  Agency Phone P:  [phone]  Agency Fax F:  [phone]  Contact Name Gracie  Equipment Arranged tube feeds + pump  Delivery Date & Location 9/14 to patients home address   Discharge Planning Resources    Flowsheet Row Most Recent Value  Contact Name Gracie  Equipment Arranged tube feeds + pump  Delivery Date & Location 9/14 to patients home address  Arrangements patient arranged  Discharge Transportation private vehicle  Date 04/30/24  Time 1100   No resources indicated at this time  Funding for Discharge Medications: Drug assistance not needed    Transportation: Arrangements: patient arranged Discharge Transportation: private  vehicle Time: 1100  Final ADT:  Final ADT Discharge Disposition: Home Based  Home Based: Home Infusion ONLY - covers Line Care, Teaching and Labs. (No Organized HH)                 Second IMM Received: Provided to patient - in person (04/25/24 1157)  Final Summary: There are no further CM needs indicated at this time.  Holmes George, MSW,LCSW-A *Some images could not be shown.

## 2024-04-30 NOTE — Progress Notes (Signed)
   04/30/24 1051  Discharge Plan  Second IMM Provided to patient - in person

## 2024-04-30 NOTE — Progress Notes (Signed)
 Discharge Note: Pt is A&O X4, VSS. PIV and PICC removed. Completed pt education on GJ tube, when and how to change dressing, etc. Received and reviewed discharge information, verbalized no further questions at this time. Provided patient with three bags of tube feed per provider. Pt left to home with husband and all belongings.

## 2024-04-30 NOTE — Progress Notes (Addendum)
 Case Management Discharge Planning Note  Barriers identified?  None  EDD: 04/30/2024  Preferred DC location: Home  Medical Readiness for Discharge:  Reassessment Documentation Goals prior to discharge: Patient is medically stable for discharge. Patient is awaiting completion of the insurance authorization for pump and tube feeds. EDD: Today, pending Mobility: ambulates independently Anticipated care needs at discharge include: Tube Feeds/TPN Preferred Discharge Location: Home  Team recommendations and collaboration for discharge planning Home self care, Home Health (New), Home Infusion, DME  Pt / Patient representative conversation and collaboration for discharge planning  Patient and/or their representative have participated in and are in agreement with discharge plan. yes  CM Next Steps: Follow for medical stability Gather information from medical team Discuss discharge plan with patient Follow up with patient Deliver follow-up IMM within 48 hours of DC. Finalize appropriate documentation flowsheets prior to discharge Update AVS as changes occur Complete Final ADT documentation Complete CM DC Summary / Closing Note  Coordinated Resources:  At 9:38am - CM contacted [employer] to inquire about status of DME - pump and tube feeds. CM spoke with Beverley who reports, Feeding bag pump has been delivered on 04/29/24 to patients home address. CM informed drop ship for tube feeds is still in process.   At 9:40am - CM received this email from [employer]ms rep -  Upon review, auth was approved and both orders were dispatched/shipped.  The pump order was delivered 04/29/24 at 2:58pm and the formula order was shipped Friday. Unfortunately, tracking information has not been uploaded as of yet but should be by tomorrow morning!  CM relayed this update to medical team via secure message.  At 9:40am - CM submitted request to [facility] to deliver 2nd IMM to patient at bedside via epic.  At  9:51am - CM contacted [facility] to inquire about SOC date. CM spoke with Etta who provided the [facility] date for Wednesday. CM is encouraged to fax the discharge summary to admin coordinator at [phone]. CM secured messaged provider requesting the discharge summary be uploaded per request of hh agency.  At 10:24am - CM faxed discharge summary to hh agency.  CM updated AVS.  Holmes George, MSW,LCSW-A

## 2024-05-01 ENCOUNTER — Telehealth: Payer: Self-pay

## 2024-05-01 NOTE — Transitions of Care (Post Inpatient/ED Visit) (Signed)
   05/01/2024  Name: Amy Horton MRN: [id number] DOB: [birth year]  Today's TOC FU Call Status: Today's TOC FU Call Status:: Unsuccessful Call (1st Attempt) Unsuccessful Call (1st Attempt) Date: 05/01/24  Attempted to reach the patient regarding the most recent Inpatient/ED visit. Patient answered and spoke a few minutes but priority was they were needing to call [employer] company for equipment, had the contact number, and requested a call back later today.   Follow Up Plan: Additional outreach attempts will be made to reach the patient to complete the Transitions of Care (Post Inpatient/ED visit) call.    Bing Edison MSN, RN RN Case Sales executive Health  [facility]BCI-Population Health Office Hours M-F [phone] Direct Dial: [phone] Main Phone [phone]  Fax: [phone] [email or web address]

## 2024-05-01 NOTE — Transitions of Care (Post Inpatient/ED Visit) (Signed)
 05/01/2024  Patient ID: Amy Horton, female   DOB: Oct 04, 1970, 53 y.o.   MRN: [id number]  05/01/24: Second outreach to patient today via [facility] RN CM post discharge outreach. ON first outreach this am, patient answered but was distressed that [employer] [facility] delivered wrong Tube feedings syringe with mismatched connections to her TF set up.   Patient had [employer] contact number and reached out to [employer] and was told it would be delivered in a few days. This was reported to RN CM on follow up call made at time of this note. Advised patient to contact [facility] again at [phone] and inform them this not acceptable as they accepted service and delivered wrong/incompatible equipment on 04/29/24 and in addition no instructions were given on how to operate pump delivered.   Patient returned call to RN CM and was unsuccessful in getting an upgrade to a faster replacement. She is unable to properly and adequately flush feeding tube as instructed and is using TF set up to flush as that part of adaptor fits, but not the flush syringe. This could result in clogging of tube requiring intervention. Patient gave verbal permission to contact [facility] to check out the issues.   TOC RN CM reached out to [facility] at a number given to RN CM by [facility] to speak to [place] as patient was told new syringe would be mailed out [phone] and a message had to be left for [place] with RN CM call back number. At 4:04 pm.   No call back from [employer]. RN CM contacted [facility] in [place], [name] and after speaking to several staff members and a pharmacist the indicated they do not carry anything larger than a 3 ml syringe or 10 ml normal saline syringes. Pharmacist recommended contacting [facility] or [employer] to check on this syringe. Call patient back with this recommendation and in the meantime she had heard back from [facility] that syringe would be there  tomorrow.   Will follow up with patient tomorrow, and complete TOC follow up call, but encouraged her to seek out replacement syringe from those sources if needed. She was too tired to complete today. 447 pm.    Bing Edison MSN, RN RN Case Manager [facility]  VBCI-Population Health Office Hours M-F [phone] Direct Dial: [phone] Main Phone [phone]  Fax: [phone] [email or web address]

## 2024-05-02 DIAGNOSIS — Z6825 Body mass index (BMI) 25.0-25.9, adult: Secondary | ICD-10-CM | POA: Diagnosis not present

## 2024-05-02 DIAGNOSIS — F419 Anxiety disorder, unspecified: Secondary | ICD-10-CM | POA: Diagnosis not present

## 2024-05-02 DIAGNOSIS — G43709 Chronic migraine without aura, not intractable, without status migrainosus: Secondary | ICD-10-CM | POA: Diagnosis not present

## 2024-05-02 DIAGNOSIS — M300 Polyarteritis nodosa: Secondary | ICD-10-CM | POA: Diagnosis not present

## 2024-05-02 DIAGNOSIS — G35 Multiple sclerosis: Secondary | ICD-10-CM | POA: Diagnosis not present

## 2024-05-02 DIAGNOSIS — I7773 Dissection of renal artery: Secondary | ICD-10-CM | POA: Diagnosis not present

## 2024-05-02 DIAGNOSIS — I712 Thoracic aortic aneurysm, without rupture, unspecified: Secondary | ICD-10-CM | POA: Diagnosis not present

## 2024-05-02 DIAGNOSIS — Z431 Encounter for attention to gastrostomy: Secondary | ICD-10-CM | POA: Diagnosis not present

## 2024-05-02 DIAGNOSIS — K3184 Gastroparesis: Secondary | ICD-10-CM | POA: Diagnosis not present

## 2024-05-02 DIAGNOSIS — E440 Moderate protein-calorie malnutrition: Secondary | ICD-10-CM | POA: Diagnosis not present

## 2024-05-15 DIAGNOSIS — K3184 Gastroparesis: Secondary | ICD-10-CM | POA: Diagnosis not present

## 2024-05-29 DIAGNOSIS — K3184 Gastroparesis: Secondary | ICD-10-CM | POA: Diagnosis not present

## 2024-05-29 DIAGNOSIS — R1084 Generalized abdominal pain: Secondary | ICD-10-CM | POA: Diagnosis not present

## 2024-05-29 DIAGNOSIS — Z934 Other artificial openings of gastrointestinal tract status: Secondary | ICD-10-CM | POA: Diagnosis not present

## 2024-05-29 DIAGNOSIS — R1013 Epigastric pain: Secondary | ICD-10-CM | POA: Diagnosis not present

## 2024-05-30 DIAGNOSIS — R1084 Generalized abdominal pain: Secondary | ICD-10-CM | POA: Diagnosis not present

## 2024-05-30 DIAGNOSIS — M300 Polyarteritis nodosa: Secondary | ICD-10-CM | POA: Diagnosis not present

## 2024-05-30 DIAGNOSIS — R748 Abnormal levels of other serum enzymes: Secondary | ICD-10-CM | POA: Diagnosis not present

## 2024-05-30 DIAGNOSIS — I10 Essential (primary) hypertension: Secondary | ICD-10-CM | POA: Diagnosis not present

## 2024-06-06 DIAGNOSIS — Z8739 Personal history of other diseases of the musculoskeletal system and connective tissue: Secondary | ICD-10-CM | POA: Diagnosis not present

## 2024-06-06 DIAGNOSIS — R109 Unspecified abdominal pain: Secondary | ICD-10-CM | POA: Diagnosis not present

## 2024-06-06 DIAGNOSIS — I7779 Dissection of other artery: Secondary | ICD-10-CM | POA: Diagnosis not present

## 2024-06-06 DIAGNOSIS — Z91014 Allergy to mammalian meats: Secondary | ICD-10-CM | POA: Diagnosis not present

## 2024-06-06 DIAGNOSIS — Z95828 Presence of other vascular implants and grafts: Secondary | ICD-10-CM | POA: Diagnosis not present

## 2024-06-06 DIAGNOSIS — K3184 Gastroparesis: Secondary | ICD-10-CM | POA: Diagnosis not present

## 2024-06-14 DIAGNOSIS — K3184 Gastroparesis: Secondary | ICD-10-CM | POA: Diagnosis not present

## 2024-06-14 DIAGNOSIS — K9423 Gastrostomy malfunction: Secondary | ICD-10-CM | POA: Diagnosis not present

## 2024-06-26 DIAGNOSIS — Z6827 Body mass index (BMI) 27.0-27.9, adult: Secondary | ICD-10-CM | POA: Diagnosis not present

## 2024-06-26 DIAGNOSIS — E785 Hyperlipidemia, unspecified: Secondary | ICD-10-CM | POA: Diagnosis not present

## 2024-06-26 DIAGNOSIS — E559 Vitamin D deficiency, unspecified: Secondary | ICD-10-CM | POA: Diagnosis not present

## 2024-06-26 DIAGNOSIS — K219 Gastro-esophageal reflux disease without esophagitis: Secondary | ICD-10-CM | POA: Diagnosis not present

## 2024-06-26 DIAGNOSIS — M300 Polyarteritis nodosa: Secondary | ICD-10-CM | POA: Diagnosis not present

## 2024-06-26 DIAGNOSIS — I10 Essential (primary) hypertension: Secondary | ICD-10-CM | POA: Diagnosis not present

## 2024-06-26 DIAGNOSIS — E1165 Type 2 diabetes mellitus with hyperglycemia: Secondary | ICD-10-CM | POA: Diagnosis not present

## 2024-06-26 DIAGNOSIS — G43909 Migraine, unspecified, not intractable, without status migrainosus: Secondary | ICD-10-CM | POA: Diagnosis not present

## 2024-06-26 DIAGNOSIS — R748 Abnormal levels of other serum enzymes: Secondary | ICD-10-CM | POA: Diagnosis not present

## 2024-06-26 DIAGNOSIS — R1084 Generalized abdominal pain: Secondary | ICD-10-CM | POA: Diagnosis not present

## 2024-07-05 DIAGNOSIS — K3184 Gastroparesis: Secondary | ICD-10-CM | POA: Diagnosis not present

## 2024-07-05 DIAGNOSIS — K30 Functional dyspepsia: Secondary | ICD-10-CM | POA: Diagnosis not present

## 2024-07-26 DIAGNOSIS — Z95828 Presence of other vascular implants and grafts: Secondary | ICD-10-CM | POA: Diagnosis not present

## 2024-07-26 DIAGNOSIS — Z1331 Encounter for screening for depression: Secondary | ICD-10-CM | POA: Diagnosis not present

## 2024-07-29 DIAGNOSIS — R1084 Generalized abdominal pain: Secondary | ICD-10-CM | POA: Diagnosis not present

## 2024-07-29 DIAGNOSIS — R1013 Epigastric pain: Secondary | ICD-10-CM | POA: Diagnosis not present

## 2024-07-29 DIAGNOSIS — Z934 Other artificial openings of gastrointestinal tract status: Secondary | ICD-10-CM | POA: Diagnosis not present

## 2024-07-30 DIAGNOSIS — I723 Aneurysm of iliac artery: Secondary | ICD-10-CM | POA: Diagnosis not present

## 2024-07-30 DIAGNOSIS — I999 Unspecified disorder of circulatory system: Secondary | ICD-10-CM | POA: Diagnosis not present

## 2024-07-30 DIAGNOSIS — I712 Thoracic aortic aneurysm, without rupture, unspecified: Secondary | ICD-10-CM | POA: Diagnosis not present

## 2024-07-30 DIAGNOSIS — I7779 Dissection of other artery: Secondary | ICD-10-CM | POA: Diagnosis not present

## 2024-07-30 NOTE — Progress Notes (Signed)
Attestation Statement:     I personally saw and evaluated the patient, and participated in the management and treatment plan as documented in the resident/fellow note.    Please see my letter for details.    G. [place] Hughes, M.D.
